# Patient Record
Sex: Male | Born: 1950 | Race: Black or African American | Hispanic: No | Marital: Single | Smoking: Former smoker
Health system: Southern US, Community
[De-identification: ages and names within clinical notes are randomized; demographics above are authoritative.]

## PROBLEM LIST (undated history)

## (undated) DIAGNOSIS — K219 Gastro-esophageal reflux disease without esophagitis: Secondary | ICD-10-CM

## (undated) DIAGNOSIS — E785 Hyperlipidemia, unspecified: Secondary | ICD-10-CM

## (undated) DIAGNOSIS — I10 Essential (primary) hypertension: Secondary | ICD-10-CM

## (undated) HISTORY — DX: Hyperlipidemia, unspecified: E78.5

## (undated) HISTORY — PX: TONSILLECTOMY: SUR1361

## (undated) HISTORY — PX: WISDOM TOOTH EXTRACTION: SHX21

## (undated) HISTORY — DX: Gastro-esophageal reflux disease without esophagitis: K21.9

## (undated) HISTORY — PX: KNEE SURGERY: SHX244

---

## 1987-02-23 DIAGNOSIS — F1910 Other psychoactive substance abuse, uncomplicated: Secondary | ICD-10-CM

## 1987-02-23 HISTORY — DX: Other psychoactive substance abuse, uncomplicated: F19.10

## 2001-05-29 ENCOUNTER — Emergency Department (HOSPITAL_COMMUNITY): Admission: EM | Admit: 2001-05-29 | Discharge: 2001-05-29 | Payer: Self-pay | Admitting: Unknown Physician Specialty

## 2001-08-24 ENCOUNTER — Encounter: Payer: Self-pay | Admitting: Internal Medicine

## 2001-08-24 ENCOUNTER — Encounter: Admission: RE | Admit: 2001-08-24 | Discharge: 2001-08-24 | Payer: Self-pay | Admitting: Internal Medicine

## 2001-11-09 ENCOUNTER — Encounter: Payer: Self-pay | Admitting: *Deleted

## 2001-11-09 ENCOUNTER — Emergency Department (HOSPITAL_COMMUNITY): Admission: EM | Admit: 2001-11-09 | Discharge: 2001-11-09 | Payer: Self-pay | Admitting: *Deleted

## 2012-05-10 ENCOUNTER — Encounter (HOSPITAL_COMMUNITY): Payer: Self-pay | Admitting: Emergency Medicine

## 2012-05-10 ENCOUNTER — Emergency Department (HOSPITAL_COMMUNITY)
Admission: EM | Admit: 2012-05-10 | Discharge: 2012-05-10 | Disposition: A | Discharge status: Home / Self Care | Payer: Federal, State, Local not specified - PPO | Attending: Emergency Medicine | Admitting: Emergency Medicine

## 2012-05-10 DIAGNOSIS — I10 Essential (primary) hypertension: Secondary | ICD-10-CM | POA: Insufficient documentation

## 2012-05-10 DIAGNOSIS — Z79899 Other long term (current) drug therapy: Secondary | ICD-10-CM | POA: Insufficient documentation

## 2012-05-10 HISTORY — DX: Essential (primary) hypertension: I10

## 2012-05-10 NOTE — ED Provider Notes (Signed)
History     CSN: [id number]  Arrival date & time 05/10/12  0825   First MD Initiated Contact with Patient 05/10/12 [phone]      Chief Complaint  Patient presents with  . Hypertension    (Consider location/radiation/quality/duration/timing/severity/associated sxs/prior treatment) HPI Comments: 62 y/o male with a history of hypertension presents to the ED with concerns of elevated blood pressure. He monitors his blood pressure daily and states last night it was 148/89 increasing to 149/90 this morning. BP reading last night was prior to taking his lisinopril which he takes before bed. He has not taken any medications today. BP upon arrival at ED 135/71. Denies any associated symptoms including chest pain, sob, headache, dizziness, visual changes, nausea or vomiting. His wife advised him to go to the ED[facility]8.  Patient is a 62 y.o. male presenting with hypertension. The history is provided by the patient.  Hypertension Pertinent negatives include no chest pain, headaches, nausea, neck pain, vomiting or weakness.    Past Medical History  Diagnosis Date  . Hypertension     History reviewed. No pertinent past surgical history.  History reviewed. No pertinent family history.  History  Substance Use Topics  . Smoking status: Not on file  . Smokeless tobacco: Not on file  . Alcohol Use: Not on file      Review of Systems  Constitutional: Negative for activity change.  HENT: Negative for neck pain and neck stiffness.   Eyes: Negative for visual disturbance.  Respiratory: Negative for shortness of breath.   Cardiovascular: Negative for chest pain.  Gastrointestinal: Negative for nausea and vomiting.  Neurological: Negative for weakness and headaches.  All other systems reviewed and are negative.    Allergies  Review of patient's allergies indicates not on file.  Home Medications  No current outpatient prescriptions on  file.  BP 135/71  Pulse 84  Temp(Src) 98.2 F (36.8 C) (Oral)  Resp 18  SpO2 100%  Physical Exam  Nursing note and vitals reviewed. Constitutional: He is oriented to person, place, and time. He appears well-developed and well-nourished. No distress.  HENT:  Head: Normocephalic and atraumatic.  Mouth/Throat: Oropharynx is clear and moist.  Eyes: Conjunctivae and EOM are normal. Pupils are equal, round, and reactive to light. No scleral icterus.  Neck: Normal range of motion. Neck supple. No JVD present. No tracheal deviation present.  Cardiovascular: Normal rate, regular rhythm, normal heart sounds and intact distal pulses.   No extremity edema.  Pulmonary/Chest: Effort normal and breath sounds normal. No respiratory distress. He has no wheezes. He has no rales.  Abdominal: Soft. Bowel sounds are normal.  Musculoskeletal: Normal range of motion. He exhibits no edema.  Neurological: He is alert and oriented to person, place, and time.  Skin: Skin is warm and dry.  Psychiatric: He has a normal mood and affect. His behavior is normal.    ED Course  Procedures (including critical care time)  Labs Reviewed - No data to display No results found.   1. Hypertension       MDM  62 y/o male with HTN. BP in ED 135/71. He has not taken his meds yet today as he takes them prior to going to bed. No associated symptoms. He came here for reassurance. He has close f/u with the [place]. Aware to continue taking lisinopril as prescribed. Return precautions discussed. Stable for discharge. Patient states understanding of plan and is agreeable.  Trevor Mace, PA-C 05/10/12 [phone]

## 2012-05-10 NOTE — ED Notes (Addendum)
Pt escorted to discharge window. Verbalized understanding discharge instructions. In no acute distress.  Vitals are stable.

## 2012-05-10 NOTE — ED Notes (Signed)
Pt states hx of htn.  States that he keeps a close check on his BP.  He took it last night and it was 148/89.  States he came in today because his BP this morning went up to 149/90.  Current BP 135/71.  No other complaints.

## 2012-05-15 NOTE — ED Provider Notes (Signed)
Medical screening examination/treatment/procedure(s) were performed by non-physician practitioner and as supervising physician I was immediately available for consultation/collaboration.   Arlen Dupuis E Zakeya Junker, MD 05/15/12 0747 

## 2013-06-08 ENCOUNTER — Ambulatory Visit: Payer: Federal, State, Local not specified - PPO | Attending: Internal Medicine | Admitting: Physical Therapy

## 2013-06-08 DIAGNOSIS — IMO0001 Reserved for inherently not codable concepts without codable children: Secondary | ICD-10-CM | POA: Insufficient documentation

## 2013-06-08 DIAGNOSIS — M542 Cervicalgia: Secondary | ICD-10-CM | POA: Insufficient documentation

## 2013-06-15 ENCOUNTER — Ambulatory Visit: Payer: Federal, State, Local not specified - PPO | Admitting: Physical Therapy

## 2013-06-22 ENCOUNTER — Ambulatory Visit: Payer: Federal, State, Local not specified - PPO | Attending: Internal Medicine | Admitting: Physical Therapy

## 2013-06-22 DIAGNOSIS — M542 Cervicalgia: Secondary | ICD-10-CM | POA: Insufficient documentation

## 2013-06-22 DIAGNOSIS — M5412 Radiculopathy, cervical region: Secondary | ICD-10-CM | POA: Insufficient documentation

## 2013-06-22 DIAGNOSIS — IMO0001 Reserved for inherently not codable concepts without codable children: Secondary | ICD-10-CM | POA: Insufficient documentation

## 2013-07-06 ENCOUNTER — Ambulatory Visit: Payer: Federal, State, Local not specified - PPO | Admitting: Physical Therapy

## 2013-07-13 ENCOUNTER — Ambulatory Visit: Payer: Federal, State, Local not specified - PPO | Admitting: Physical Therapy

## 2013-07-20 ENCOUNTER — Ambulatory Visit: Payer: Federal, State, Local not specified - PPO | Admitting: Physical Therapy

## 2013-07-30 ENCOUNTER — Encounter (HOSPITAL_COMMUNITY): Payer: Self-pay | Admitting: Emergency Medicine

## 2013-07-30 ENCOUNTER — Emergency Department (HOSPITAL_COMMUNITY)
Admission: EM | Admit: 2013-07-30 | Discharge: 2013-07-30 | Disposition: A | Discharge status: Home / Self Care | Payer: Federal, State, Local not specified - PPO | Source: Home / Self Care | Attending: Emergency Medicine | Admitting: Emergency Medicine

## 2013-07-30 DIAGNOSIS — M25469 Effusion, unspecified knee: Secondary | ICD-10-CM

## 2013-07-30 DIAGNOSIS — M25461 Effusion, right knee: Secondary | ICD-10-CM

## 2013-07-30 MED ORDER — INDOMETHACIN 25 MG PO CAPS: 25.0000 mg | ORAL_CAPSULE | Freq: Three times a day (TID) | ORAL | Status: DC

## 2013-07-30 NOTE — Discharge Instructions (Signed)
Medication as directed with ice, elevation and Ace wrap to try to reduce swelling. If these therapies do not help, please follow up with either the orthopedist listed on your discharge instructions or the after hours orthopedics clinic on the information sheet you have been given.   Knee Effusion The medical term for having fluid in your knee is effusion. This is often due to an internal derangement of the knee. This means something is wrong inside the knee. Some of the causes of fluid in the knee may be torn cartilage, a torn ligament, or bleeding into the joint from an injury. Your knee is likely more difficult to bend and move. This is often because there is increased pain and pressure in the joint. The time it takes for recovery from a knee effusion depends on different factors, including:   Type of injury.  Your age.  Physical and medical conditions.  Rehabilitation Strategies. How long you will be away from your normal activities will depend on what kind of knee problem you have and how much damage is present. Your knee has two types of cartilage. Articular cartilage covers the bone ends and lets your knee bend and move smoothly. Two menisci, thick pads of cartilage that form a rim inside the joint, help absorb shock and stabilize your knee. Ligaments bind the bones together and support your knee joint. Muscles move the joint, help support your knee, and take stress off the joint itself. CAUSES  Often an effusion in the knee is caused by an injury to one of the menisci. This is often a tear in the cartilage. Recovery after a meniscus injury depends on how much meniscus is damaged and whether you have damaged other knee tissue. Small tears may heal on their own with conservative treatment. Conservative means rest, limited weight bearing activity and muscle strengthening exercises. Your recovery may take up to 6 weeks.  TREATMENT  Larger tears may require surgery. Meniscus injuries may be treated  during arthroscopy. Arthroscopy is a procedure in which your surgeon uses a small telescope like instrument to look in your knee. Your caregiver can make a more accurate diagnosis (learning what is wrong) by performing an arthroscopic procedure. If your injury is on the inner margin of the meniscus, your surgeon may trim the meniscus back to a smooth rim. In other cases your surgeon will try to repair a damaged meniscus with stitches (sutures). This may make rehabilitation take longer, but may provide better long term result by helping your knee keep its shock absorption capabilities. Ligaments which are completely torn usually require surgery for repair. HOME CARE INSTRUCTIONS  Use crutches as instructed.  If a brace is applied, use as directed.  Once you are home, an ice pack applied to your swollen knee may help with discomfort and help decrease swelling.  Keep your knee raised (elevated) when you are not up and around or on crutches.  Only take over-the-counter or prescription medicines for pain, discomfort, or fever as directed by your caregiver.  Your caregivers will help with instructions for rehabilitation of your knee. This often includes strengthening exercises.  You may resume a normal diet and activities as directed. SEEK MEDICAL CARE IF:   There is increased swelling in your knee.  You notice redness, swelling, or increasing pain in your knee.  An unexplained oral temperature above 102 F (38.9 C) develops. SEEK IMMEDIATE MEDICAL CARE IF:   You develop a rash.  You have difficulty breathing.  You have any  allergic reactions from medications you may have been given.  There is severe pain with any motion of the knee. MAKE SURE YOU:   Understand these instructions.  Will watch your condition.  Will get help right away if you are not doing well or get worse. Document Released: 05/01/2003 Document Revised: 05/03/2011 Document Reviewed: 07/05/2007 [facility] Patient  Information 2014 [place], [place].

## 2013-07-30 NOTE — ED Provider Notes (Signed)
CSN: [id number]     Arrival date & time 07/30/13  1929 History   First MD Initiated Contact with Patient 07/30/13 2025     Chief Complaint  Patient presents with  . Knee Pain   (Consider location/radiation/quality/duration/timing/severity/associated sxs/prior Treatment) HPI Comments: Patient reports chronic history of intermittent right knee swelling with discomfort. States 3-4 years ago he had knee drained by an orthopedist and cortisone injected into knee joint, but cannot recall the name of his provider. States that he has had intermittent episdes of right knee swelling over past 2-3 months with current episode having begun 2 weeks ago. Saw his PCP in [place], [place] for same on 07-28-2013 and was prescribed prednisone, but he states he does not wish to take this medication because "it makes me fat." Denies fever or history of gout. States pain is minimal but joint is uncomfortable because of swelling.  Patient is a 63 y.o. male presenting with knee pain.  Knee Pain   Past Medical History  Diagnosis Date  . Hypertension    Past Surgical History  Procedure Laterality Date  . Knee surgery     No family history on file. History  Substance Use Topics  . Smoking status: Never Smoker   . Smokeless tobacco: Not on file  . Alcohol Use: No    Review of Systems  All other systems reviewed and are negative.   Allergies  Review of patient's allergies indicates no known allergies.  Home Medications   Prior to Admission medications   Medication Sig Start Date End Date Taking? Authorizing Provider  nortriptyline (PAMELOR) 25 MG capsule Take 25 mg by mouth at bedtime.   Yes Historical Provider, MD  traMADol (ULTRAM) 50 MG tablet Take by mouth every 6 (six) hours as needed.   Yes Historical Provider, MD  ALPRAZolam Prudy Feeler(XANAX) 1 MG tablet Take 1 mg by mouth 2 (two) times daily.    Historical Provider, MD  esomeprazole (NEXIUM) 40 MG capsule Take 40 mg by mouth daily before breakfast.     Historical Provider, MD  HYDROcodone-acetaminophen (NORCO/VICODIN) 5-325 MG per tablet Take 1 tablet by mouth 2 (two) times daily as needed for pain.    Historical Provider, MD  indomethacin (INDOCIN) 25 MG capsule Take 1 capsule (25 mg total) by mouth 3 (three) times daily with meals. X 7 days Do not use in combination with ibuprofen 07/30/13   Ardis RowanJennifer Lee Clemmie Buelna, PA  lisinopril (PRINIVIL,ZESTRIL) 40 MG tablet Take 20 mg by mouth daily.    Historical Provider, MD   BP 133/91  Pulse 74  Temp(Src) 97.5 F (36.4 C) (Oral)  Resp 16  SpO2 96% Physical Exam  Nursing note and vitals reviewed. Constitutional: He is oriented to person, place, and time. He appears well-developed and well-nourished. No distress.  HENT:  Head: Normocephalic and atraumatic.  Eyes: Conjunctivae are normal. No scleral icterus.  Cardiovascular: Normal rate.   Pulmonary/Chest: Effort normal.  Musculoskeletal: Normal range of motion.       Right knee: He exhibits swelling and effusion. He exhibits normal range of motion, no ecchymosis, no deformity, no laceration, no erythema, normal alignment, no LCL laxity, normal patellar mobility, no bony tenderness and no MCL laxity. No tenderness found. No medial joint line, no lateral joint line and no patellar tendon tenderness noted.  Moderate right knee effusion. No tenderness, erythema, warmth or induration   Neurological: He is alert and oriented to person, place, and time.  Skin: Skin is warm and dry. No rash noted.  No erythema.  Psychiatric: He has a normal mood and affect. His behavior is normal.    ED Course  Procedures (including critical care time) Labs Review Labs Reviewed - No data to display  Imaging Review No results found.   MDM   1. Knee effusion, right   Ace wrap, elevation, ice, indocin as directed x 7 days. If no improvement, use orthopedic referral information for follow up evaluation and possible joint aspiration. No clinical concern for septic  joint.    Jess Barters [place], [place] 07/30/13 2110

## 2013-07-30 NOTE — ED Notes (Signed)
Right knee with fluid on his knee.  History of the same.  Patient usually goes to [facility] hospital.  Particular aching of right knee made patient seek medical attention tonight

## 2013-07-31 NOTE — ED Provider Notes (Signed)
Medical screening examination/treatment/procedure(s) were performed by non-physician practitioner and as supervising physician I was immediately available for consultation/collaboration.  Leslee Home, M.D.  Reuben Likes, MD 07/31/13 [phone]

## 2013-08-03 ENCOUNTER — Ambulatory Visit: Payer: Federal, State, Local not specified - PPO | Admitting: Physical Therapy

## 2013-08-10 ENCOUNTER — Ambulatory Visit: Payer: Federal, State, Local not specified - PPO | Attending: Internal Medicine | Admitting: Physical Therapy

## 2013-08-10 DIAGNOSIS — IMO0001 Reserved for inherently not codable concepts without codable children: Secondary | ICD-10-CM | POA: Insufficient documentation

## 2013-08-10 DIAGNOSIS — M5412 Radiculopathy, cervical region: Secondary | ICD-10-CM | POA: Insufficient documentation

## 2013-08-10 DIAGNOSIS — M542 Cervicalgia: Secondary | ICD-10-CM | POA: Insufficient documentation

## 2013-08-17 ENCOUNTER — Ambulatory Visit: Payer: Federal, State, Local not specified - PPO | Admitting: Physical Therapy

## 2013-08-31 ENCOUNTER — Ambulatory Visit: Payer: Federal, State, Local not specified - PPO | Attending: Internal Medicine | Admitting: Physical Therapy

## 2013-08-31 DIAGNOSIS — M542 Cervicalgia: Secondary | ICD-10-CM | POA: Insufficient documentation

## 2013-08-31 DIAGNOSIS — IMO0001 Reserved for inherently not codable concepts without codable children: Secondary | ICD-10-CM | POA: Insufficient documentation

## 2013-08-31 DIAGNOSIS — M5412 Radiculopathy, cervical region: Secondary | ICD-10-CM | POA: Insufficient documentation

## 2014-02-22 HISTORY — PX: COLONOSCOPY: SHX174

## 2014-03-01 ENCOUNTER — Ambulatory Visit (HOSPITAL_COMMUNITY)
Admission: RE | Admit: 2014-03-01 | Discharge: 2014-03-01 | Disposition: A | Discharge status: Home / Self Care | Payer: Federal, State, Local not specified - PPO | Source: Ambulatory Visit | Attending: Internal Medicine | Admitting: Internal Medicine

## 2014-03-01 ENCOUNTER — Other Ambulatory Visit (HOSPITAL_COMMUNITY): Payer: Self-pay | Admitting: Internal Medicine

## 2014-03-01 ENCOUNTER — Other Ambulatory Visit: Payer: Self-pay

## 2014-03-01 DIAGNOSIS — R0602 Shortness of breath: Secondary | ICD-10-CM

## 2014-03-01 LAB — LIPID PANEL
CHOL/HDL RATIO: 3.6 ratio
Cholesterol: 192 mg/dL (ref 0–200)
HDL: 53 mg/dL (ref >39)
LDL Cholesterol: 128 mg/dL — ABNORMAL HIGH (ref 0–99)
Triglycerides: 57 mg/dL (ref <150)
VLDL: 11 mg/dL (ref 0–40)

## 2014-03-01 LAB — COMPREHENSIVE METABOLIC PANEL
ALBUMIN: 4.3 g/dL (ref 3.5–5.2)
ALT: 15 U/L (ref 0–53)
ANION GAP: 4 — AB (ref 5–15)
AST: 20 U/L (ref 0–37)
Alkaline Phosphatase: 73 U/L (ref 39–117)
BILIRUBIN TOTAL: 0.5 mg/dL (ref 0.3–1.2)
BUN: 12 mg/dL (ref 6–23)
CALCIUM: 9.5 mg/dL (ref 8.4–10.5)
CHLORIDE: 103 meq/L (ref 96–112)
CO2: 29 mmol/L (ref 19–32)
CREATININE: 1.15 mg/dL (ref 0.50–1.35)
GFR calc Af Amer: 78 mL/min — ABNORMAL LOW (ref >90)
GFR calc non Af Amer: 67 mL/min — ABNORMAL LOW (ref >90)
Glucose, Bld: 95 mg/dL (ref 70–99)
Potassium: 4.3 mmol/L (ref 3.5–5.1)
SODIUM: 136 mmol/L (ref 135–145)
Total Protein: 7.9 g/dL (ref 6.0–8.3)

## 2014-03-01 LAB — CK: Total CK: 186 U/L (ref 7–232)

## 2014-03-21 ENCOUNTER — Inpatient Hospital Stay (HOSPITAL_COMMUNITY)
Admission: RE | Admit: 2014-03-21 | Discharge: 2014-03-21 | Disposition: A | Discharge status: Home / Self Care | Payer: Federal, State, Local not specified - PPO | Source: Ambulatory Visit

## 2014-03-21 NOTE — Pre-Procedure Instructions (Signed)
Threasa HeadsLloyd T Prevost  03/21/2014  Your procedure is scheduled on:  04-01-2014   Monday    [facility]00 AM.  Call this number if you have problems the morning of surgery: [phone]   Remember:    Do not eat food or drink liquids after midnight.   Take these medicines the morning of surgery with A SIP OF WATER: Albuterol inhaler if needed,alprazolam(xanax),tramadol(Ultram) if needed   Do not wear jewelry,  Do not wear lotions, powders, or perfumes. You may not wear deodorant.   Do not shave 48 hours prior to surgery. Men may shave face and neck.   Do not bring valuables to the hospital.  [facility] is not responsible  for any belongings or valuables.               Contacts, dentures or bridgework may not be worn into surgery.   Leave suitcase in the car. After surgery it may be brought to your room.   For patients admitted to the hospital, discharge time is determined by your  treatment team.                   Special Instructions: See attached sheet for instructions on CHG shower     Please read over the following fact sheets that you were given: Pain Booklet, Coughing and Deep Breathing, Blood Transfusion Information and Surgical Site Infection Prevention

## 2014-04-01 ENCOUNTER — Encounter (HOSPITAL_COMMUNITY): Admission: RE | Payer: Self-pay | Source: Ambulatory Visit

## 2014-04-01 ENCOUNTER — Inpatient Hospital Stay (HOSPITAL_COMMUNITY)
Admission: RE | Admit: 2014-04-01 | Payer: Federal, State, Local not specified - PPO | Source: Ambulatory Visit | Admitting: Orthopedic Surgery

## 2014-04-01 SURGERY — ARTHROPLASTY, KNEE, TOTAL
Anesthesia: General | Site: Knee | Laterality: Left

## 2014-05-08 ENCOUNTER — Encounter (HOSPITAL_COMMUNITY): Payer: Self-pay | Admitting: Emergency Medicine

## 2014-05-08 ENCOUNTER — Emergency Department (HOSPITAL_COMMUNITY)
Admission: EM | Admit: 2014-05-08 | Discharge: 2014-05-08 | Disposition: A | Discharge status: Home / Self Care | Payer: Federal, State, Local not specified - PPO | Attending: Emergency Medicine | Admitting: Emergency Medicine

## 2014-05-08 DIAGNOSIS — Z79899 Other long term (current) drug therapy: Secondary | ICD-10-CM | POA: Diagnosis not present

## 2014-05-08 DIAGNOSIS — Z791 Long term (current) use of non-steroidal anti-inflammatories (NSAID): Secondary | ICD-10-CM | POA: Insufficient documentation

## 2014-05-08 DIAGNOSIS — I10 Essential (primary) hypertension: Secondary | ICD-10-CM | POA: Diagnosis not present

## 2014-05-08 DIAGNOSIS — R220 Localized swelling, mass and lump, head: Secondary | ICD-10-CM | POA: Diagnosis present

## 2014-05-08 DIAGNOSIS — K047 Periapical abscess without sinus: Secondary | ICD-10-CM | POA: Diagnosis not present

## 2014-05-08 MED ORDER — AMOXICILLIN-POT CLAVULANATE 875-125 MG PO TABS
1.0000 | ORAL_TABLET | Freq: Two times a day (BID) | ORAL | Status: DC
Start: 2014-05-08 — End: 2017-05-09

## 2014-05-08 MED ORDER — HYDROCODONE-ACETAMINOPHEN 5-325 MG PO TABS: 1.0000 | ORAL_TABLET | Freq: Four times a day (QID) | ORAL | Status: DC | PRN

## 2014-05-08 MED ORDER — AMOXICILLIN-POT CLAVULANATE 875-125 MG PO TABS
1.0000 | ORAL_TABLET | Freq: Once | ORAL | Status: AC
  Administered 2014-05-08: 1 via ORAL
  Filled 2014-05-08: qty 1

## 2014-05-08 MED ORDER — IBUPROFEN 800 MG PO TABS
800.0000 mg | ORAL_TABLET | Freq: Once | ORAL | Status: AC
  Administered 2014-05-08: 800 mg via ORAL
  Filled 2014-05-08: qty 1

## 2014-05-08 NOTE — Discharge Instructions (Signed)
Please follow up closely with a dentist tomorrow for further care.  Return if you develop throat swelling, trouble swallowing or if you have other concerns.   Dental Abscess A dental abscess is a collection of infected fluid (pus) from a bacterial infection in the inner part of the tooth (pulp). It usually occurs at the end of the tooth's root.  CAUSES   Severe tooth decay.  Trauma to the tooth that allows bacteria to enter into the pulp, such as a broken or chipped tooth. SYMPTOMS   Severe pain in and around the infected tooth.  Swelling and redness around the abscessed tooth or in the mouth or face.  Tenderness.  Pus drainage.  Bad breath.  Bitter taste in the mouth.  Difficulty swallowing.  Difficulty opening the mouth.  Nausea.  Vomiting.  Chills.  Swollen neck glands. DIAGNOSIS   A medical and dental history will be taken.  An examination will be performed by tapping on the abscessed tooth.  X-rays may be taken of the tooth to identify the abscess. TREATMENT The goal of treatment is to eliminate the infection. You may be prescribed antibiotic medicine to stop the infection from spreading. A root canal may be performed to save the tooth. If the tooth cannot be saved, it may be pulled (extracted) and the abscess may be drained.  HOME CARE INSTRUCTIONS  Only take over-the-counter or prescription medicines for pain, fever, or discomfort as directed by your caregiver.  Rinse your mouth (gargle) often with salt water ( tsp salt in 8 oz [250 ml] of warm water) to relieve pain or swelling.  Do not drive after taking pain medicine (narcotics).  Do not apply heat to the outside of your face.  Return to your dentist for further treatment as directed. SEEK MEDICAL CARE IF:  Your pain is not helped by medicine.  Your pain is getting worse instead of better. SEEK IMMEDIATE MEDICAL CARE IF:  You have a fever or persistent symptoms for more than 2-3 days.  You  have a fever and your symptoms suddenly get worse.  You have chills or a very bad headache.  You have problems breathing or swallowing.  You have trouble opening your mouth.  You have swelling in the neck or around the eye. Document Released: 02/08/2005 Document Revised: 11/03/2011 Document Reviewed: 05/19/2010 [facility] Patient Information 2015 [place]. This information is not intended to replace advice given to you by your health care provider. Make sure you discuss any questions you have with your health care provider.

## 2014-05-08 NOTE — ED Notes (Signed)
Pt c/o jaw swelling since this morning. Pt has visible swelling to R lower jaw. Pt sts he has one tooth that is loose and painful on the side. Pt sts his teeth are throbbing. Pt A&Ox4. Pt sts chewing and cold air makes the pain worse.

## 2014-05-08 NOTE — ED Provider Notes (Signed)
CSN: [id number]     Arrival date & time 05/08/14  1756 History  This chart was scribed for non-physician practitioner working with Doug SouSam Jacubowitz, MD by Angelene GiovanniEmmanuella Mensah, ED Scribe. The patient was seen in room WTR5/WTR5 and the patient's care was started at 6:24 PM      No chief complaint on file.  The history is provided by the patient. No language interpreter was used.   HPI Comments: Albert Jordan is a 64 y.o. male who presents to the Emergency Department complaining of constant gradually worsening right jaw swelling onset this morning. He explains that his teeth was loose last night and he tried to pull it with a string but when he woke up this morning, he noticed the right jaw swelling. He reports associated right teeth throbbing pain. He reports that chewing and cold air makes the pain worse. He explains that he was sweating all last night. He denies trouble swallowing. He states that he took 2 Xanax but was unable to sleep. He denies having a Education officer, communityDentist.   PCP: Dr. Roseanne RenoHassan   Past Medical History  Diagnosis Date  . Hypertension    Past Surgical History  Procedure Laterality Date  . Knee surgery     No family history on file. History  Substance Use Topics  . Smoking status: Never Smoker   . Smokeless tobacco: Not on file  . Alcohol Use: No    Review of Systems  HENT: Positive for dental problem and facial swelling. Negative for trouble swallowing.       Allergies  Review of patient's allergies indicates no known allergies.  Home Medications   Prior to Admission medications   Medication Sig Start Date End Date Taking? Authorizing Provider  albuterol (PROVENTIL HFA;VENTOLIN HFA) 108 (90 BASE) MCG/ACT inhaler Inhale 2 puffs into the lungs every 6 (six) hours as needed for wheezing or shortness of breath.    Historical Provider, MD  ALPRAZolam Prudy Feeler(XANAX) 1 MG tablet Take 1 mg by mouth 2 (two) times daily.    Historical Provider, MD  ibuprofen (ADVIL,MOTRIN) 800 MG tablet Take  800 mg by mouth every 6 (six) hours as needed (pain).    Historical Provider, MD  indomethacin (INDOCIN) 25 MG capsule Take 1 capsule (25 mg total) by mouth 3 (three) times daily with meals. X 7 days Do not use in combination with ibuprofen Patient not taking: Reported on 03/18/2014 07/30/13   Mathis FareJennifer Lee H Presson, PA  lisinopril (PRINIVIL,ZESTRIL) 40 MG tablet Take 40 mg by mouth daily.     Historical Provider, MD  lovastatin (MEVACOR) 20 MG tablet Take 20 mg by mouth daily. 02/03/14   Historical Provider, MD  nortriptyline (PAMELOR) 50 MG capsule Take 50 mg by mouth at bedtime. 03/01/14   Historical Provider, MD  traMADol (ULTRAM) 50 MG tablet Take 50 mg by mouth 3 (three) times daily. scheduled    Historical Provider, MD   There were no vitals taken for this visit. Physical Exam  Constitutional: He is oriented to person, place, and time. He appears well-developed and well-nourished. No distress.  HENT:  Head: Normocephalic and atraumatic.  Mouth/Throat: No trismus in the jaw. No uvula swelling. No oropharyngeal exudate.  Uvula midline.  No tonsillar swelling  Eyes: Conjunctivae and EOM are normal.  Neck: Neck supple. No tracheal deviation present.  Cardiovascular: Normal rate.   Pulmonary/Chest: Effort normal. No respiratory distress.  Musculoskeletal: Normal range of motion.  Tenderness to teeth 31 with moderate facial swelling noted to RL  jaw without trismus without erythema.   Neurological: He is alert and oriented to person, place, and time.  Skin: Skin is warm and dry.  Psychiatric: He has a normal mood and affect. His behavior is normal.  Nursing note and vitals reviewed.   ED Course  Procedures (including critical care time) DIAGNOSTIC STUDIES: Oxygen Saturation is 98% on RA, normal by my interpretation.    COORDINATION OF CARE: 6:28 PM- Pt advised of plan for treatment and pt agrees.   Suspect periapical abscess from the loose tooth causing facial involvement. Plan to  prescribe antibiotic, pain medication, and dental referral. Strict return precautions if patient develops trouble swallowing, worsening pain, or fever. Low suspicion for parotiditis. At this time is no evidence of airway involvement.   Labs Review Labs Reviewed - No data to display  Imaging Review No results found.   EKG Interpretation None      MDM   Final diagnoses:  Periapical abscess with facial involvement    BP 146/98 mmHg  Pulse 103  Temp(Src) 99.8 F (37.7 C) (Oral)  Resp 16  SpO2 98%   I personally performed the services described in this documentation, which was scribed in my presence. The recorded information has been reviewed and is accurate.      Fayrene Helper, PA-C 05/08/14 1902  Doug Sou, MD 05/09/14 0010

## 2016-03-16 ENCOUNTER — Emergency Department (HOSPITAL_COMMUNITY): Payer: Federal, State, Local not specified - PPO

## 2016-03-16 ENCOUNTER — Encounter (HOSPITAL_COMMUNITY): Payer: Self-pay | Admitting: Emergency Medicine

## 2016-03-16 ENCOUNTER — Emergency Department (HOSPITAL_COMMUNITY)
Admission: EM | Admit: 2016-03-16 | Discharge: 2016-03-16 | Disposition: A | Discharge status: Home / Self Care | Payer: Federal, State, Local not specified - PPO | Attending: Emergency Medicine | Admitting: Emergency Medicine

## 2016-03-16 DIAGNOSIS — Y999 Unspecified external cause status: Secondary | ICD-10-CM | POA: Diagnosis not present

## 2016-03-16 DIAGNOSIS — M7021 Olecranon bursitis, right elbow: Secondary | ICD-10-CM | POA: Diagnosis not present

## 2016-03-16 DIAGNOSIS — M25521 Pain in right elbow: Secondary | ICD-10-CM | POA: Diagnosis present

## 2016-03-16 DIAGNOSIS — Y929 Unspecified place or not applicable: Secondary | ICD-10-CM | POA: Diagnosis not present

## 2016-03-16 DIAGNOSIS — I10 Essential (primary) hypertension: Secondary | ICD-10-CM | POA: Insufficient documentation

## 2016-03-16 DIAGNOSIS — Y9389 Activity, other specified: Secondary | ICD-10-CM | POA: Diagnosis not present

## 2016-03-16 DIAGNOSIS — W000XXA Fall on same level due to ice and snow, initial encounter: Secondary | ICD-10-CM | POA: Insufficient documentation

## 2016-03-16 NOTE — ED Triage Notes (Addendum)
Patient states that he fell when it snowed on some ice. Patient states he hit his right elbow. He has iced it down but the swelling is still there. Patient states he is not in pain but wants it to be looked at.

## 2016-03-16 NOTE — ED Provider Notes (Signed)
[facility]-EMERGENCY DEPT Provider Note   CSN: [id number] Arrival date & time: 03/16/16  2120     History   Chief Complaint Chief Complaint  Patient presents with  . Elbow Pain    HPI Albert Jordan is a 66 y.o. male.  HPI Albert Jordan is a 66 y.o. male with history of hypertension, presents to emergency department complaining of right elbow pain. Patient states that he fell onto his left elbow approximately 10 days ago and again the day after that. He states he has applied ice pack and has taken some over-the-counter pain pills which has helped his pain and swelling. He states that he noticed in the last few days he developed some fluid on the elbow. He has no difficulty moving his elbow. He denies any fever or chills. He states he tried to drain the fluid with a "hot needle." He states he could not get any fluid out. He is here to have them looked at. He denies any other injuries during his fall. He denies any numbness or tingling to the hand, he has no other complaints at this time.  Past Medical History:  Diagnosis Date  . Hypertension     There are no active problems to display for this patient.   Past Surgical History:  Procedure Laterality Date  . KNEE SURGERY         Home Medications    Prior to Admission medications   Medication Sig Start Date End Date Taking? Authorizing Provider  albuterol (PROVENTIL HFA;VENTOLIN HFA) 108 (90 BASE) MCG/ACT inhaler Inhale 2 puffs into the lungs every 6 (six) hours as needed for wheezing or shortness of breath.    Historical Provider, MD  ALPRAZolam Prudy Feeler) 1 MG tablet Take 1 mg by mouth 2 (two) times daily.    Historical Provider, MD  amoxicillin-clavulanate (AUGMENTIN) 875-125 MG per tablet Take 1 tablet by mouth 2 (two) times daily. One po bid x 7 days 05/08/14   Fayrene Helper, PA-C  HYDROcodone-acetaminophen (NORCO/VICODIN) 5-325 MG per tablet Take 1 tablet by mouth every 6 (six) hours as needed for moderate pain. 05/08/14   Fayrene Helper,  PA-C  ibuprofen (ADVIL,MOTRIN) 800 MG tablet Take 800 mg by mouth every 6 (six) hours as needed (pain).    Historical Provider, MD  indomethacin (INDOCIN) 25 MG capsule Take 1 capsule (25 mg total) by mouth 3 (three) times daily with meals. X 7 days Do not use in combination with ibuprofen Patient not taking: Reported on 03/18/2014 07/30/13   Mathis Fare Presson, PA  lisinopril (PRINIVIL,ZESTRIL) 40 MG tablet Take 40 mg by mouth daily.     Historical Provider, MD  lovastatin (MEVACOR) 20 MG tablet Take 20 mg by mouth daily. 02/03/14   Historical Provider, MD  nortriptyline (PAMELOR) 50 MG capsule Take 50 mg by mouth at bedtime. 03/01/14   Historical Provider, MD    Family History History reviewed. No pertinent family history.  Social History Social History  Substance Use Topics  . Smoking status: Never Smoker  . Smokeless tobacco: Never Used  . Alcohol use No     Allergies   Patient has no known allergies.   Review of Systems Review of Systems  Constitutional: Negative for chills and fever.  Musculoskeletal: Positive for arthralgias and joint swelling. Negative for myalgias.  Skin: Negative for color change and wound.  Neurological: Negative for weakness and numbness.     Physical Exam Updated Vital Signs BP 121/75 (BP Location: Left Arm)  Pulse 63   Temp 97.6 F (36.4 C) (Oral)   Resp 16   Ht 6\' 1"  (1.854 m)   Wt 85.8 kg   SpO2 97%   BMI 24.96 kg/m   Physical Exam  Constitutional: He is oriented to person, place, and time. He appears well-developed and well-nourished. No distress.  Eyes: Conjunctivae are normal.  Neck: Neck supple.  Cardiovascular: Normal rate.   Pulmonary/Chest: No respiratory distress.  Abdominal: He exhibits no distension.  Musculoskeletal:  Swelling over right olecranon of the elbow. There is no erythema, no warmth to the touch, no tenderness. Full range of motion of the right elbow joint. Distal radial pulses intact.  Neurological: He is  oriented to person, place, and time.  Skin: Skin is warm and dry.  Nursing note and vitals reviewed.    ED Treatments / Results  Labs (all labs ordered are listed, but only abnormal results are displayed) Labs Reviewed - No data to display  EKG  EKG Interpretation None       Radiology Dg Elbow Complete Right  Result Date: 03/16/2016 CLINICAL DATA:  Elbow pain and swelling after a fall 5 days ago. EXAM: RIGHT ELBOW - COMPLETE 3+ VIEW COMPARISON:  None. FINDINGS: Posterior spurring noted on the olecranon with age indeterminate fracture. Otherwise no acute bony abnormality is evident. There is no fat pad elevation to suggest joint effusion. Posterior soft tissue fullness suggests edema. IMPRESSION: No gross fracture or joint effusion. Olecranon spur with age indeterminate fracture and overlying soft tissue swelling. Electronically Signed   By: Kennith CenterEric  Mansell M.D.   On: 03/16/2016 22:06    Procedures Procedures (including critical care time)  Medications Ordered in ED Medications - No data to display   Initial Impression / Assessment and Plan / ED Course  I have reviewed the triage vital signs and the nursing notes.  Pertinent labs & imaging results that were available during my care of the patient were reviewed by me and considered in my medical decision making (see chart for details).    Patient with what appears to be olecranon bursitis to the right elbow. Patient does have olecranon spur with age indeterminant fracture on the x-ray and soft tissue swelling. I question whether he did fracture this bone spur during his fall a week ago. I will have him follow up with orthopedic specialist. Ace wrap applied for comfort. Will start on NSAIDs.   Vitals:   03/16/16 2123  BP: 121/75  Pulse: 63  Resp: 16  Temp: 97.6 F (36.4 C)    Final Clinical Impressions(s) / ED Diagnoses   Final diagnoses:  Olecranon bursitis of right elbow    New Prescriptions Discharge Medication  List as of 03/16/2016 10:40 PM       Jaynie Crumbleatyana Dredyn Gubbels, PA-C 03/17/16 0008    Mancel BaleElliott Wentz, MD 03/17/16 [id number]

## 2016-03-16 NOTE — ED Notes (Signed)
Jon notified of order.

## 2016-03-16 NOTE — Discharge Instructions (Signed)
Your symptoms are most consistent with bursitis. You do have a bone spur that you may have broken off during your fall, however no bone fractures were seen on xray. Keep arm elevated at home. Ibuprofen or aleve for pain and inflammation. ACE wrap for support. Follow up with orthopedics.

## 2016-07-07 ENCOUNTER — Ambulatory Visit (INDEPENDENT_AMBULATORY_CARE_PROVIDER_SITE_OTHER): Payer: Self-pay | Admitting: Orthopedic Surgery

## 2016-07-16 ENCOUNTER — Encounter (INDEPENDENT_AMBULATORY_CARE_PROVIDER_SITE_OTHER): Payer: Self-pay | Admitting: Orthopedic Surgery

## 2016-07-16 ENCOUNTER — Ambulatory Visit (INDEPENDENT_AMBULATORY_CARE_PROVIDER_SITE_OTHER): Payer: Federal, State, Local not specified - PPO | Admitting: Orthopedic Surgery

## 2016-07-16 ENCOUNTER — Ambulatory Visit (INDEPENDENT_AMBULATORY_CARE_PROVIDER_SITE_OTHER): Payer: Federal, State, Local not specified - PPO

## 2016-07-16 VITALS — Ht 73.0 in | Wt 189.0 lb

## 2016-07-16 DIAGNOSIS — G8929 Other chronic pain: Secondary | ICD-10-CM | POA: Diagnosis not present

## 2016-07-16 DIAGNOSIS — M25561 Pain in right knee: Secondary | ICD-10-CM

## 2016-07-16 DIAGNOSIS — M170 Bilateral primary osteoarthritis of knee: Secondary | ICD-10-CM

## 2016-07-16 NOTE — Progress Notes (Signed)
   Office Visit Note   Patient: Albert Jordan           Date of Birth: 05/12/1950           MRN: [id number] Visit Date: 07/16/2016              Requested by: No referring provider defined for this encounter. PCP: Patient, No Pcp Per  Chief Complaint  Patient presents with  . Right Knee - Pain      HPI: Patient is a [age] year old gentleman who [facility] He has had viscous supplements he has had Pain and swelling with aspiration. He states he is unable to bend his knee secondary to effusion prior to the aspiration. Patient has worked as a Designer, fashion/clothingroofer.  Assessment & Plan: Visit Diagnoses:  1. Chronic pain of right knee   2. Bilateral primary osteoarthritis of knee     Plan: Discussed with patient his best option would be a total knee replacement. Discussed risks and benefits including infection neurovascular injury DVT need for additional surgery. Patient states he understands and would like to think about surgical intervention and call when he is ready to schedule surgery. At this time he would like to proceed with a total knee arthroplasty on the right.  Follow-Up Instructions: Return if symptoms worsen or fail to improve.   Ortho Exam  Patient is alert, oriented, no adenopathy, well-dressed, normal affect, normal respiratory effort. Patient has an antalgic gait he has valgus alignment to both knees. There is no effusion in either knee at this time collaterals and cruciates are stable. He is globally tender to palpation there is crepitation of patellofemoral joint with range of motion.  Imaging: Xr Knee 1-2 Views Right  Result Date: 07/16/2016 AP radiograph of both knees shows bone-on-bone contact medial joint line with osteophytic bone spurs both medially and laterally was some condylar sclerosis and subchondral cysts. Lateral radiograph of the right knee shows large osteophytic bone spurs with essentially bone-on-bone contact.   Labs: No  results found for: HGBA1C, ESRSEDRATE, CRP, LABURIC, REPTSTATUS, GRAMSTAIN, CULT, LABORGA  Orders:  Orders Placed This Encounter  Procedures  . XR Knee 1-2 Views Right   No orders of the defined types were placed in this encounter.    Procedures: No procedures performed  Clinical Data: No additional findings.  ROS:  All other systems negative, except as noted in the HPI. Review of Systems  Objective: Vital Signs: Ht 6\' 1"  (1.854 m)   Wt 189 lb (85.7 kg)   BMI 24.94 kg/m   Specialty Comments:  No specialty comments available.  PMFS History: Patient Active Problem List   Diagnosis Date Noted  . Bilateral primary osteoarthritis of knee 07/16/2016   Past Medical History:  Diagnosis Date  . Hypertension     History reviewed. No pertinent family history.  Past Surgical History:  Procedure Laterality Date  . KNEE SURGERY     Social History   Occupational History  . Not on file.   Social History Main Topics  . Smoking status: Never Smoker  . Smokeless tobacco: Never Used  . Alcohol use No  . Drug use: No  . Sexual activity: Not on file

## 2016-07-28 ENCOUNTER — Ambulatory Visit (HOSPITAL_COMMUNITY)
Admission: RE | Admit: 2016-07-28 | Discharge: 2016-07-28 | Disposition: A | Discharge status: Home / Self Care | Payer: Federal, State, Local not specified - PPO | Source: Ambulatory Visit | Attending: Internal Medicine | Admitting: Internal Medicine

## 2016-07-28 ENCOUNTER — Other Ambulatory Visit (HOSPITAL_COMMUNITY): Payer: Self-pay | Admitting: Internal Medicine

## 2016-07-28 DIAGNOSIS — J40 Bronchitis, not specified as acute or chronic: Secondary | ICD-10-CM

## 2016-07-28 DIAGNOSIS — R0602 Shortness of breath: Secondary | ICD-10-CM | POA: Diagnosis not present

## 2016-08-20 ENCOUNTER — Telehealth (INDEPENDENT_AMBULATORY_CARE_PROVIDER_SITE_OTHER): Payer: Self-pay | Admitting: Orthopedic Surgery

## 2016-08-20 ENCOUNTER — Ambulatory Visit (INDEPENDENT_AMBULATORY_CARE_PROVIDER_SITE_OTHER): Payer: Federal, State, Local not specified - PPO | Admitting: Orthopedic Surgery

## 2016-08-20 NOTE — Telephone Encounter (Signed)
Returned call to patient left message for a return call °

## 2017-05-09 ENCOUNTER — Emergency Department (HOSPITAL_COMMUNITY)
Admission: EM | Admit: 2017-05-09 | Discharge: 2017-05-09 | Disposition: A | Discharge status: Home / Self Care | Payer: Federal, State, Local not specified - PPO | Attending: Emergency Medicine | Admitting: Emergency Medicine

## 2017-05-09 ENCOUNTER — Other Ambulatory Visit: Payer: Self-pay

## 2017-05-09 ENCOUNTER — Encounter (HOSPITAL_COMMUNITY): Payer: Self-pay | Admitting: Emergency Medicine

## 2017-05-09 DIAGNOSIS — L237 Allergic contact dermatitis due to plants, except food: Secondary | ICD-10-CM | POA: Diagnosis not present

## 2017-05-09 DIAGNOSIS — Z79899 Other long term (current) drug therapy: Secondary | ICD-10-CM | POA: Insufficient documentation

## 2017-05-09 DIAGNOSIS — L2489 Irritant contact dermatitis due to other agents: Secondary | ICD-10-CM

## 2017-05-09 DIAGNOSIS — L299 Pruritus, unspecified: Secondary | ICD-10-CM | POA: Diagnosis not present

## 2017-05-09 DIAGNOSIS — R21 Rash and other nonspecific skin eruption: Secondary | ICD-10-CM | POA: Diagnosis present

## 2017-05-09 MED ORDER — DEXAMETHASONE 4 MG PO TABS
8.0000 mg | ORAL_TABLET | Freq: Once | ORAL | Status: AC
  Administered 2017-05-09: 8 mg via ORAL
  Filled 2017-05-09: qty 2

## 2017-05-09 MED ORDER — PREDNISONE 50 MG PO TABS: ORAL_TABLET | ORAL | 0 refills | Status: DC

## 2017-05-09 NOTE — ED Provider Notes (Signed)
[facility] Provider Note   CSN: [id number] Arrival date & time: 05/09/17  0247     History   Chief Complaint Chief Complaint  Patient presents with  . Burning Eyes    itching    HPI Albert Jordan is a 67 y.o. male.  The history is provided by the patient.  Rash   This is a new problem. The current episode started 12 to 24 hours ago. The problem has been gradually worsening. The problem is associated with plant contact. There has been no fever. The rash is present on the face and left arm. The pain is moderate. The pain has been constant since onset. Associated symptoms include itching.  Reports he had to remove some weeds from his new house, and about a day later began having a rash to his face and his left arm.  He reports with swelling around his eyes.  He suspects he was exposed to poison ivy.  No shortness of breath.  No tongue or lip swelling. No Visual changes Past Medical History:  Diagnosis Date  . Hypertension     Patient Active Problem List   Diagnosis Date Noted  . Bilateral primary osteoarthritis of knee 07/16/2016    Past Surgical History:  Procedure Laterality Date  . KNEE SURGERY         Home Medications    Prior to Admission medications   Medication Sig Start Date End Date Taking? Authorizing Provider  albuterol (PROVENTIL HFA;VENTOLIN HFA) 108 (90 BASE) MCG/ACT inhaler Inhale 2 puffs into the lungs every 6 (six) hours as needed for wheezing or shortness of breath.    [provider]  ALPRAZolam Prudy Feeler) 1 MG tablet Take 1 mg by mouth 2 (two) times daily.    [provider]  amoxicillin-clavulanate (AUGMENTIN) 875-125 MG per tablet Take 1 tablet by mouth 2 (two) times daily. One po bid x 7 days 05/08/14   Fayrene Helper, PA-C  HYDROcodone-acetaminophen (NORCO/VICODIN) 5-325 MG per tablet Take 1 tablet by mouth every 6 (six) hours as needed for moderate pain. 05/08/14   Fayrene Helper, PA-C  ibuprofen  (ADVIL,MOTRIN) 800 MG tablet Take 800 mg by mouth every 6 (six) hours as needed (pain).    [provider]  indomethacin (INDOCIN) 25 MG capsule Take 1 capsule (25 mg total) by mouth 3 (three) times daily with meals. X 7 days Do not use in combination with ibuprofen Patient not taking: Reported on 03/18/2014 07/30/13   Ria Clock, PA  lisinopril (PRINIVIL,ZESTRIL) 40 MG tablet Take 40 mg by mouth daily.     [provider]  lovastatin (MEVACOR) 20 MG tablet Take 20 mg by mouth daily. 02/03/14   [provider]  nortriptyline (PAMELOR) 50 MG capsule Take 50 mg by mouth at bedtime. 03/01/14   [provider]    Family History History reviewed. No pertinent family history.  Social History Social History   Tobacco Use  . Smoking status: Never Smoker  . Smokeless tobacco: Never Used  Substance Use Topics  . Alcohol use: No  . Drug use: No     Allergies   Patient has no known allergies.   Review of Systems Review of Systems  Constitutional: Negative for fever.  Eyes: Negative for visual disturbance.  Respiratory: Negative for shortness of breath.   Skin: Positive for itching and rash.  All other systems reviewed and are negative.    Physical Exam Updated Vital Signs BP 133/71 (BP Location:  Left Arm)   Pulse 67   Temp 98 F (36.7 C) (Oral)   Resp 18   Ht 1.854 m (6\' 1" )   Wt 88.9 kg (196 lb)   SpO2 (!) 71%   BMI 25.86 kg/m   Physical Exam CONSTITUTIONAL: Well developed/well nourished HEAD: Normocephalic/atraumatic EYES: EOMI/PERRL mild periorbital edema, no conjunctival injection ENMT: Mucous membranes moist, no angioedema NECK: supple no meningeal signs CV: S1/S2 noted, no murmurs/rubs/gallops noted LUNGS: Lungs are clear to auscultation bilaterally, no apparent distress ABDOMEN: soft NEURO: Pt is awake/alert/appropriate, moves all extremitiesx4.  No facial droop.   EXTREMITIES: pulses normal/equal, full ROM, rash  noted left arm. SKIN: warm, rash noted left arm PSYCH: no abnormalities of mood noted, alert and oriented to situation   ED Treatments / Results  Labs (all labs ordered are listed, but only abnormal results are displayed) Labs Reviewed - No data to display  EKG  EKG Interpretation None       Radiology No results found.  Procedures Procedures (including critical care time)  Medications Ordered in ED Medications  dexamethasone (DECADRON) tablet 8 mg (not administered)     Initial Impression / Assessment and Plan / ED Course  I have reviewed the triage vital signs and the nursing notes.      Patient  with probable contact dermatitis.  Will start Decadron and sent home with prednisone   Of note, initial pulse ox 71 percent, but suspect this was an error.  Repeat pulse ox 95%  Final Clinical Impressions(s) / ED Diagnoses   Final diagnoses:  Irritant contact dermatitis due to other agents    ED Discharge Orders        Ordered    predniSONE (DELTASONE) 50 MG tablet     05/09/17 0414       Zadie RhineWickline, Yazlin Ekblad, MD 05/09/17 [phone]

## 2017-05-09 NOTE — ED Notes (Signed)
Bed: [facility] Expected date:  Expected time:  Means of arrival:  Comments: 

## 2017-05-09 NOTE — ED Triage Notes (Signed)
Patient was cleaning off the side of the house. The weeds ended up being poison ivy. Patient eyes are itchy, swollen, and burning.

## 2017-12-20 IMAGING — DX DG CHEST 2V
2 series · 2 of 2 positions shown · non-contrast
Comparison: Chest x-ray of March 01, 2014

CLINICAL DATA: Clinical bronchitis.  Currently on medication.

EXAM:
CHEST  2 VIEW

[chest pa]
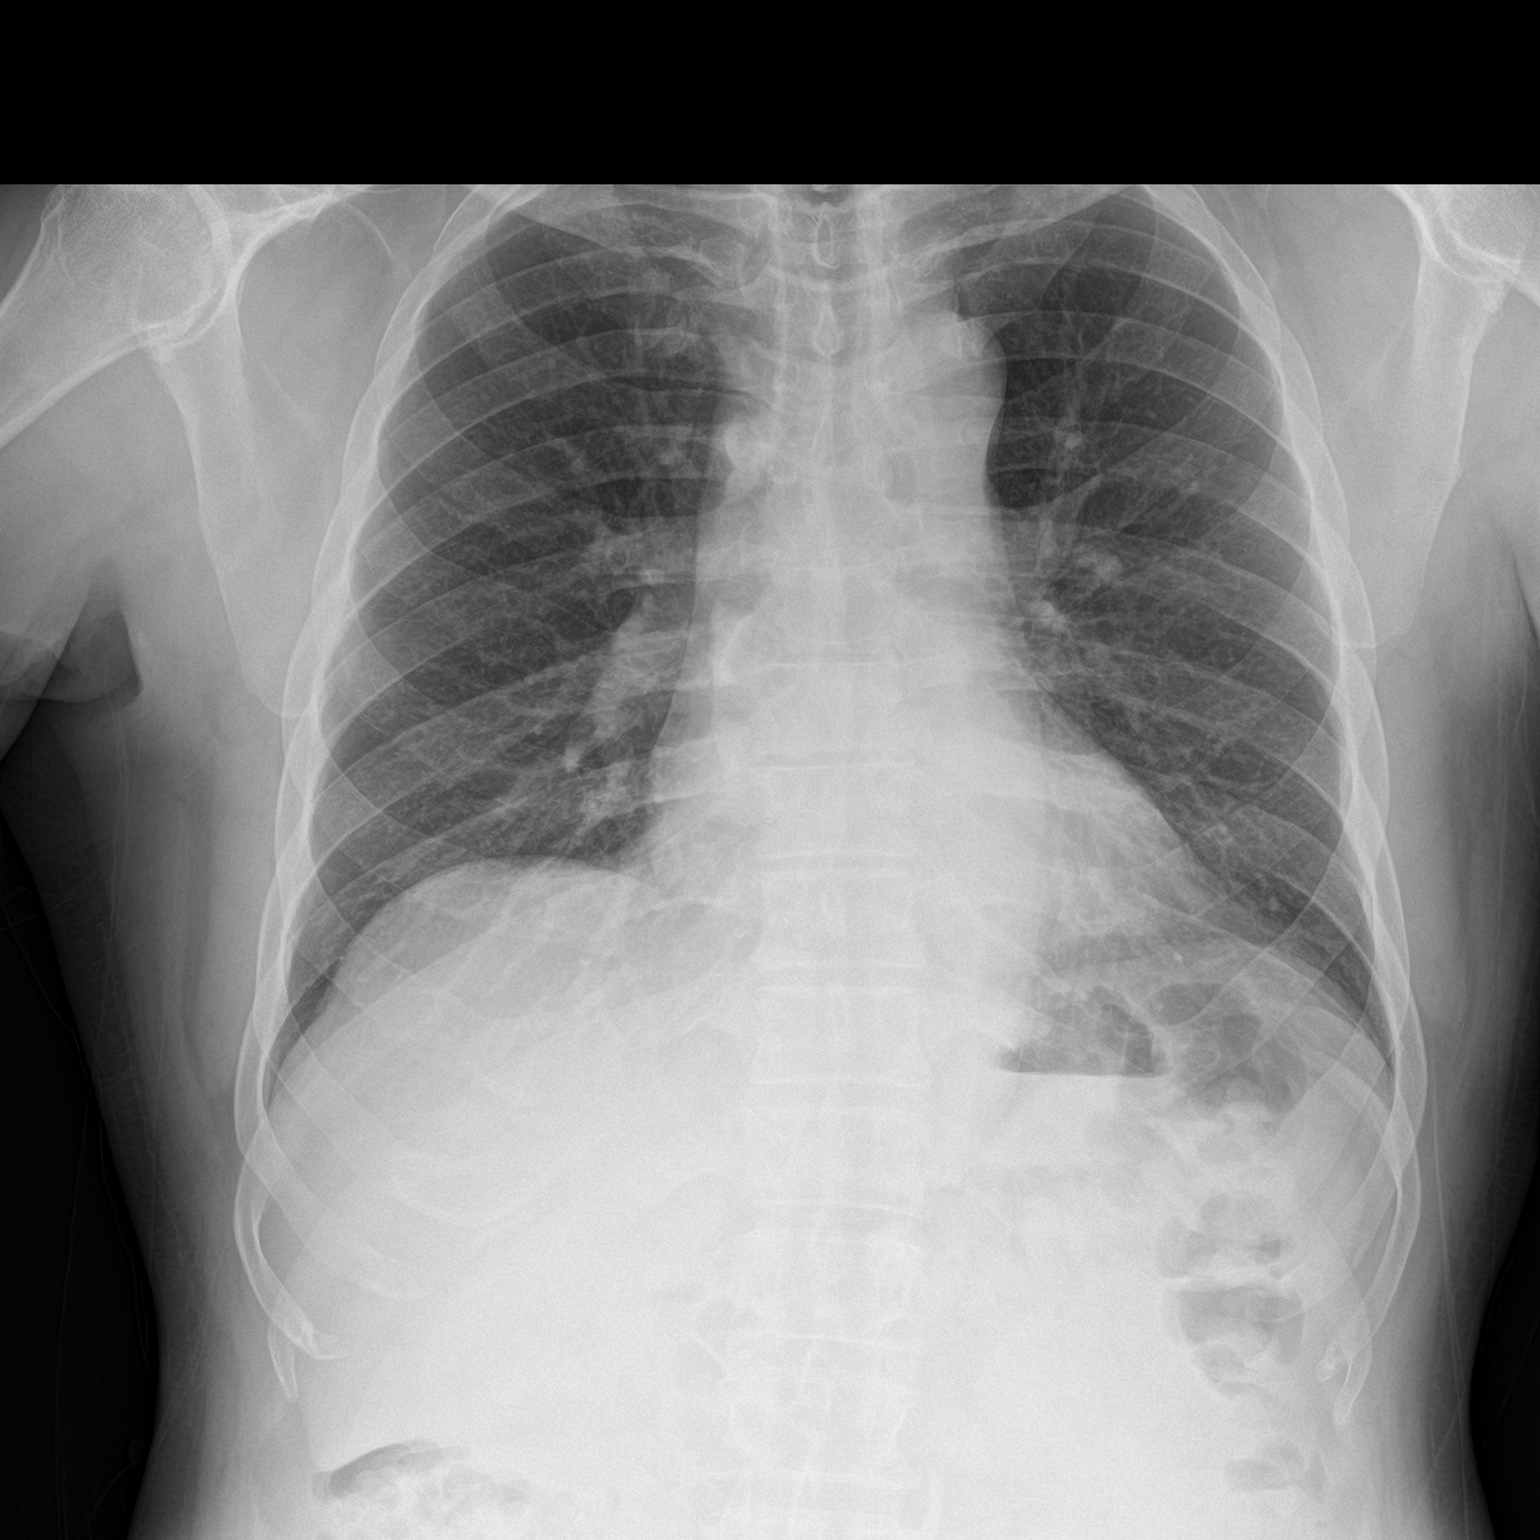

[chest lat]
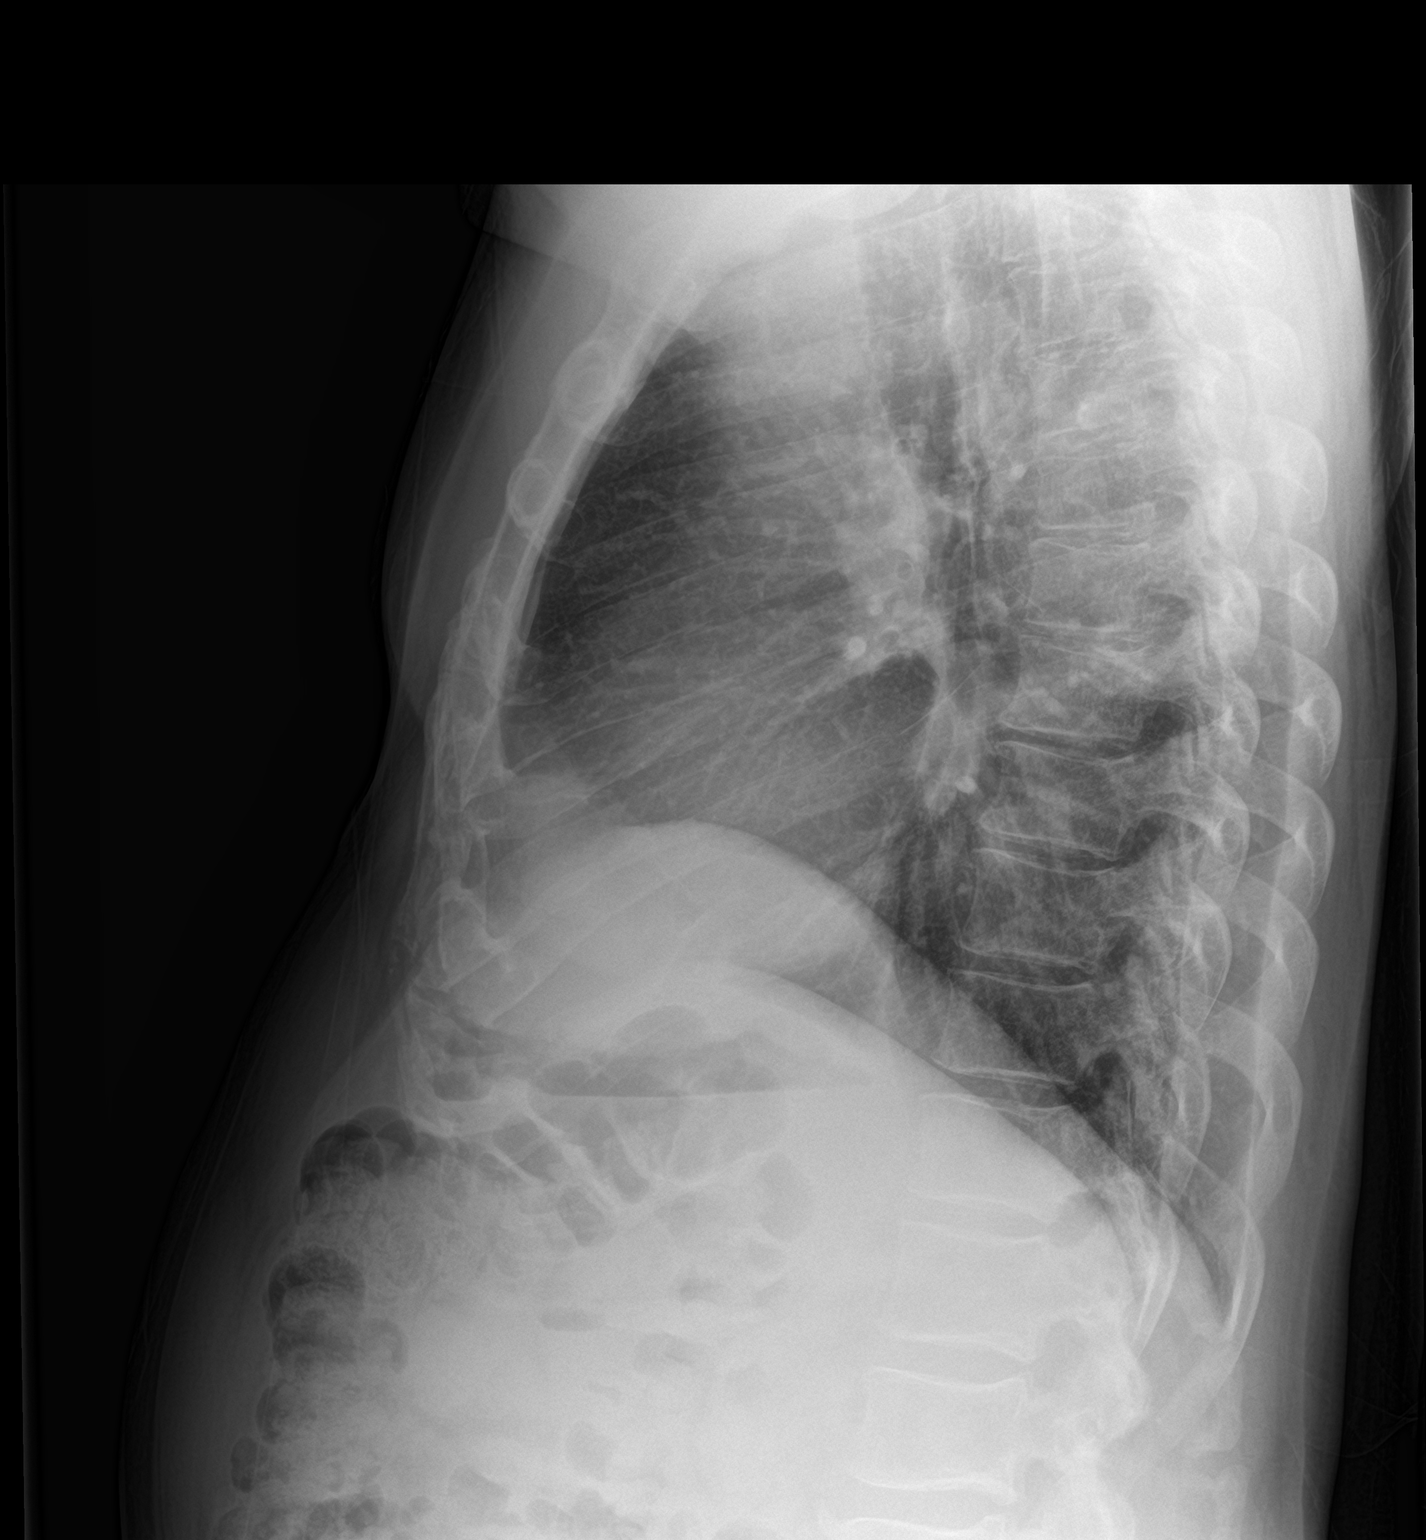

[2 of 2 positions shown; findings below may reference images not displayed]

FINDINGS: The lungs are adequately inflated and clear. There is no
hemidiaphragm flattening. The heart and pulmonary vascularity are
normal. The mediastinum is normal in width. There is no pleural
effusion. The bony thorax is unremarkable.
IMPRESSION: There is no pneumonia, hyperinflation, nor other acute
cardiopulmonary abnormality.

These results will be called to the ordering clinician or
representative by the Radiologist Assistant, and communication
documented in the PACS or zVision Dashboard.

## 2018-04-26 ENCOUNTER — Other Ambulatory Visit (HOSPITAL_COMMUNITY): Payer: Self-pay | Admitting: Internal Medicine

## 2018-04-26 ENCOUNTER — Ambulatory Visit (HOSPITAL_COMMUNITY)
Admission: RE | Admit: 2018-04-26 | Discharge: 2018-04-26 | Disposition: A | Discharge status: Home / Self Care | Payer: Medicare Other | Source: Ambulatory Visit | Attending: Internal Medicine | Admitting: Internal Medicine

## 2018-04-26 DIAGNOSIS — Z09 Encounter for follow-up examination after completed treatment for conditions other than malignant neoplasm: Secondary | ICD-10-CM | POA: Insufficient documentation

## 2019-03-16 ENCOUNTER — Ambulatory Visit: Payer: Medicare Other | Attending: Internal Medicine

## 2019-03-16 DIAGNOSIS — Z23 Encounter for immunization: Secondary | ICD-10-CM

## 2019-03-19 NOTE — Progress Notes (Signed)
   Covid-19 Vaccination Clinic  Name:  Albert Jordan    MRN: [id number] DOB: [birth year]  03/16/2019  [name] was observed post Covid-19 immunization for 15 minutes without incidence. He was provided with Vaccine Information Sheet and instruction to access the V-Safe system.   [name] was instructed to call 911 with any severe reactions post vaccine: Marland Kitchen Difficulty breathing  . Swelling of your face and throat  . A fast heartbeat  . A bad rash all over your body  . Dizziness and weakness    Immunizations Administered    Name Date Dose VIS Date Route   Moderna COVID-19 Vaccine 03/16/2019 12:16 PM 0.5 mL 01/23/2019 Intramuscular   Manufacturer: Moderna   Lot: [id number]   NDC: 24268-341-96

## 2019-04-13 ENCOUNTER — Ambulatory Visit: Payer: Medicare Other

## 2019-04-20 ENCOUNTER — Ambulatory Visit: Payer: Medicare Other | Attending: Internal Medicine

## 2019-04-20 DIAGNOSIS — Z23 Encounter for immunization: Secondary | ICD-10-CM | POA: Insufficient documentation

## 2019-04-20 NOTE — Progress Notes (Signed)
   Covid-19 Vaccination Clinic  Name:  Albert Jordan    MRN: [id number] DOB: May 03, 1950  04/20/2019  Albert Jordan was observed post Covid-19 immunization for 15 minutes without incidence. He was provided with Vaccine Information Sheet and instruction to access the V-Safe system.   Albert Jordan was instructed to call 911 with any severe reactions post vaccine: Marland Kitchen Difficulty breathing  . Swelling of your face and throat  . A fast heartbeat  . A bad rash all over your body  . Dizziness and weakness    Immunizations Administered    Name Date Dose VIS Date Route   Moderna COVID-19 Vaccine 04/20/2019 12:01 PM 0.5 mL 01/23/2019 Intramuscular   Manufacturer: Moderna   Lot: [id number]   NDC: 58592-924-46

## 2019-09-18 IMAGING — DX DG CHEST 2V
2 series · 2 of 2 positions shown · non-contrast
Comparison: 07/28/2016

CLINICAL DATA: Bronchitis, back pain

EXAM:
CHEST - 2 VIEW

[chest pa]
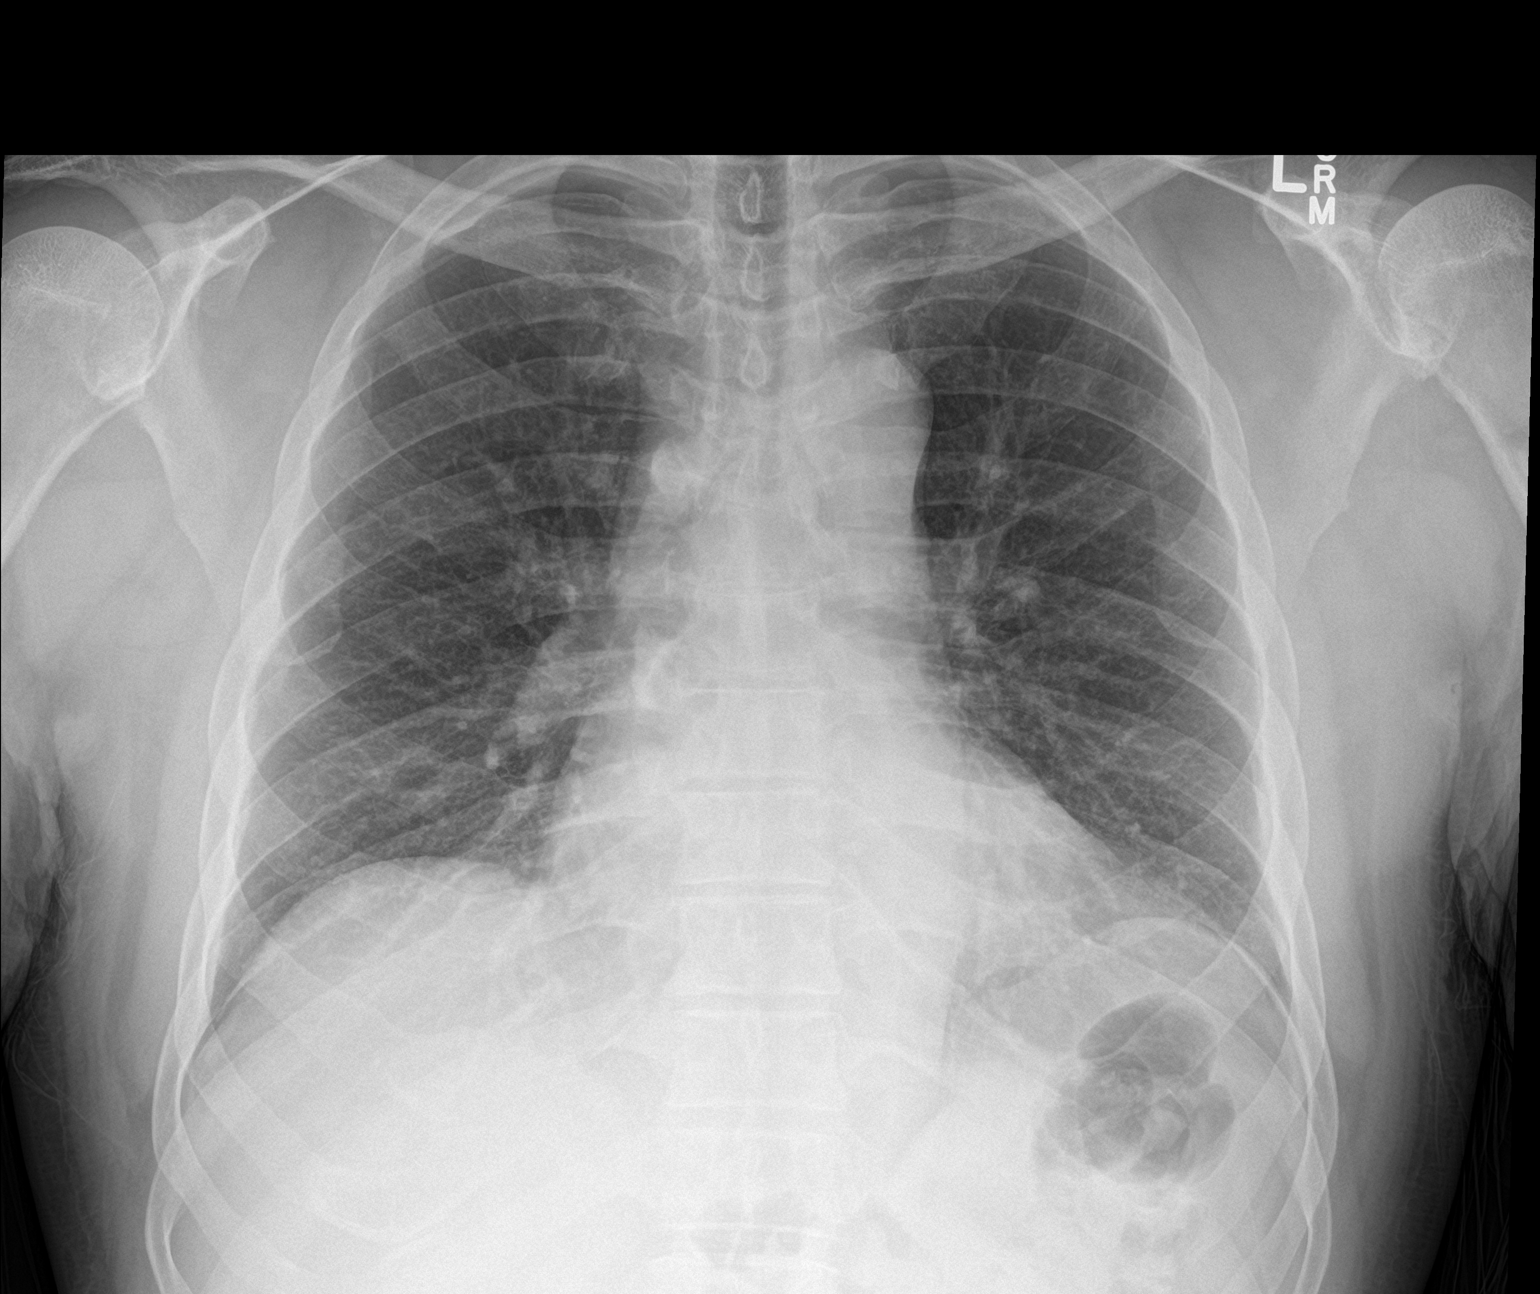

[chest lat]
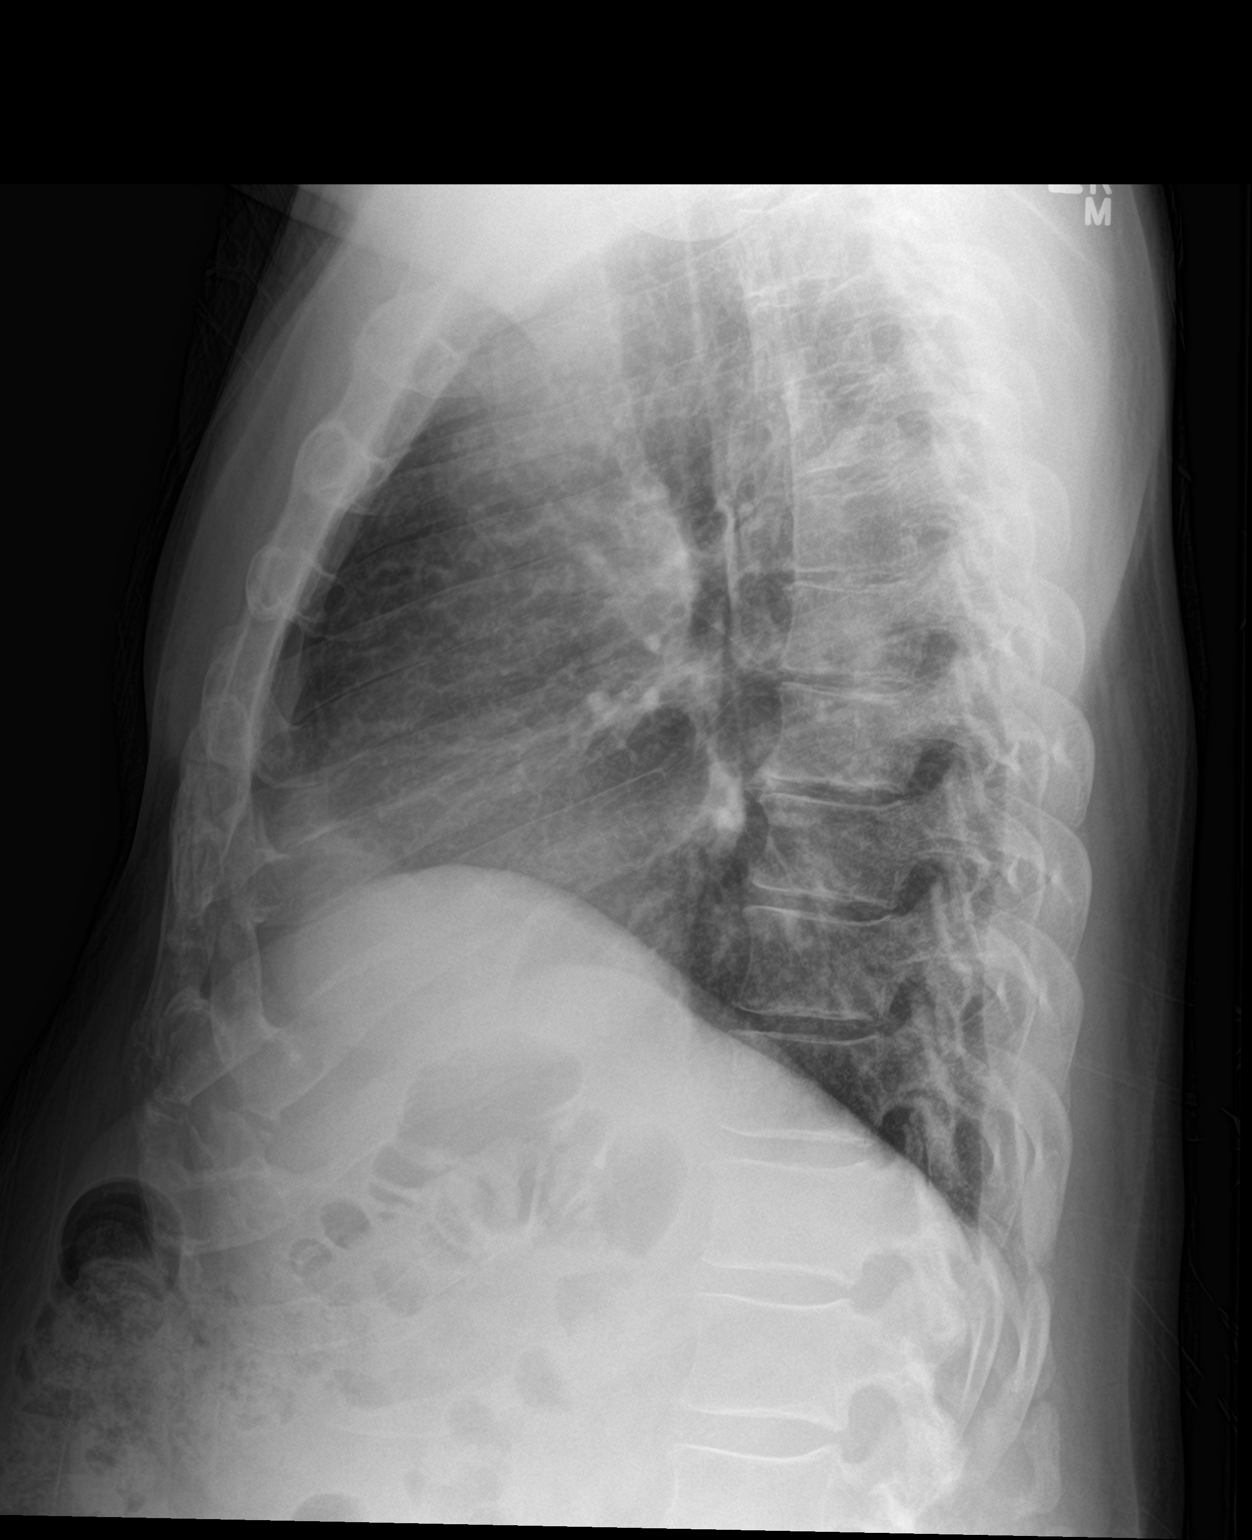

[2 of 2 positions shown; findings below may reference images not displayed]

FINDINGS: Heart and mediastinal contours are within normal limits. No focal
opacities or effusions. No acute bony abnormality.
IMPRESSION: No active cardiopulmonary disease.

## 2019-12-27 ENCOUNTER — Telehealth: Payer: Self-pay | Admitting: Gastroenterology

## 2019-12-27 NOTE — Telephone Encounter (Signed)
Hey Dr Meridee Score, this pt is being referred to [place] for a colonoscopy by Dr Quitman Livings but it looks like the pt had one done in 2016, I will send the records to you for review, please advise on scheduling.

## 2019-12-29 NOTE — Telephone Encounter (Signed)
See paper referral for next steps in evaluation and discussion.

## 2020-01-01 ENCOUNTER — Encounter: Payer: Self-pay | Admitting: Gastroenterology

## 2020-01-30 ENCOUNTER — Other Ambulatory Visit: Payer: Self-pay

## 2020-01-30 ENCOUNTER — Ambulatory Visit (AMBULATORY_SURGERY_CENTER): Payer: Self-pay

## 2020-01-30 VITALS — Ht 73.0 in | Wt 206.0 lb

## 2020-01-30 DIAGNOSIS — Z1211 Encounter for screening for malignant neoplasm of colon: Secondary | ICD-10-CM

## 2020-01-30 NOTE — Progress Notes (Signed)
No egg or soy allergy known to patient  No issues with past sedation with any surgeries or procedures No intubation problems in the past  No FH of Malignant Hyperthermia No diet pills per patient No home 02 use per patient  No blood thinners per patient  Pt denies issues with constipation  No A fib or A flutter  COVID 19 guidelines implemented in [facility] today with Pt and RN  Coupon given to pt in [facility] today , Code to Pharmacy  COVID vaccines completed on 03/2019 per pt;  Due to the COVID-19 pandemic we are asking patients to follow these guidelines. Please only bring one care partner. Please be aware that your care partner may wait in the car in the parking lot or if they feel like they will be too hot to wait in the car, they may wait in the lobby on the 4th floor. All care partners are required to wear a mask the entire time (we do not have any that we can provide them), they need to practice social distancing, and we will do a Covid check for all patient's and care partners when you arrive. Also we will check their temperature and your temperature. If the care partner waits in their car they need to stay in the parking lot the entire time and we will call them on their cell phone when the patient is ready for discharge so they can bring the car to the front of the building. Also all patient's will need to wear a mask into building. 

## 2020-02-28 ENCOUNTER — Ambulatory Visit (AMBULATORY_SURGERY_CENTER): Payer: Medicare Other | Admitting: Gastroenterology

## 2020-02-28 ENCOUNTER — Encounter: Payer: Self-pay | Admitting: Gastroenterology

## 2020-02-28 ENCOUNTER — Other Ambulatory Visit: Payer: Self-pay

## 2020-02-28 VITALS — BP 118/76 | HR 64 | Temp 97.8°F | Resp 22 | Ht 73.0 in | Wt 206.0 lb

## 2020-02-28 DIAGNOSIS — K641 Second degree hemorrhoids: Secondary | ICD-10-CM | POA: Diagnosis not present

## 2020-02-28 DIAGNOSIS — K5900 Constipation, unspecified: Secondary | ICD-10-CM | POA: Diagnosis not present

## 2020-02-28 DIAGNOSIS — Z1211 Encounter for screening for malignant neoplasm of colon: Secondary | ICD-10-CM

## 2020-02-28 MED ORDER — SODIUM CHLORIDE 0.9 % IV SOLN: 500.0000 mL | Freq: Once | INTRAVENOUS | Status: DC

## 2020-02-28 NOTE — Progress Notes (Unsigned)
PT taken to PACU. Monitors in place. VSS. Report given to RN. 

## 2020-02-28 NOTE — Patient Instructions (Signed)
YOU HAD AN ENDOSCOPIC PROCEDURE TODAY AT THE  ENDOSCOPY CENTER:   Refer to the procedure report that was given to you for any specific questions about what was found during the examination.  If the procedure report does not answer your questions, please call your gastroenterologist to clarify.  If you requested that your care partner not be given the details of your procedure findings, then the procedure report has been included in a sealed envelope for you to review at your convenience later.  YOU SHOULD EXPECT: Some feelings of bloating in the abdomen. Passage of more gas than usual.  Walking can help get rid of the air that was put into your GI tract during the procedure and reduce the bloating. If you had a lower endoscopy (such as a colonoscopy or flexible sigmoidoscopy) you may notice spotting of blood in your stool or on the toilet paper. If you underwent a bowel prep for your procedure, you may not have a normal bowel movement for a few days.  **Handouts given on High Fiber diet and hemorrhoids**  Please Note:  You might notice some irritation and congestion in your nose or some drainage.  This is from the oxygen used during your procedure.  There is no need for concern and it should clear up in a day or so.  SYMPTOMS TO REPORT IMMEDIATELY:   Following lower endoscopy (colonoscopy or flexible sigmoidoscopy):  Excessive amounts of blood in the stool  Significant tenderness or worsening of abdominal pains  Swelling of the abdomen that is new, acute  Fever of 100F or higher   For urgent or emergent issues, a gastroenterologist can be reached at any hour by calling [phone]. Do not use MyChart messaging for urgent concerns.    DIET:  We do recommend a small meal at first, but then you may proceed to your regular diet.  Drink plenty of fluids but you should avoid alcoholic beverages for 24 hours.  ACTIVITY:  You should plan to take it easy for the rest of today and you should  NOT DRIVE or use heavy machinery until tomorrow (because of the sedation medicines used during the test).    FOLLOW UP: Our staff will call the number listed on your records 48-72 hours following your procedure to check on you and address any questions or concerns that you may have regarding the information given to you following your procedure. If we do not reach you, we will leave a message.  We will attempt to reach you two times.  During this call, we will ask if you have developed any symptoms of COVID 19. If you develop any symptoms (ie: fever, flu-like symptoms, shortness of breath, cough etc.) before then, please call [phone].  If you test positive for Covid 19 in the 2 weeks post procedure, please call and report this information to [place].    If any biopsies were taken you will be contacted by phone or by letter within the next 1-3 weeks.  Please call us at [phone] if you have not heard about the biopsies in 3 weeks.    SIGNATURES/CONFIDENTIALITY: You and/or your care partner have signed paperwork which will be entered into your electronic medical record.  These signatures attest to the fact that that the information above on your After Visit Summary has been reviewed and is understood.  Full responsibility of the confidentiality of this discharge information lies with you and/or your care-partner.

## 2020-02-28 NOTE — Op Note (Signed)
[place] Patient Name: Rudolpho Jordan Procedure Date: 02/28/2020 1:19 PM MRN: [id number] Endoscopist: Justice Britain , MD Age: 70 Referring MD:  Date of Birth: [birth year] Gender: Male Account #: [id number] Procedure:                Colonoscopy Indications:              Colon cancer screening in patient at increased                            risk: Family history of 1st-degree relative with                            colon polyps before age 64 years, Incidental                            constipation noted; last in 2016 poor preparation Medicines:                Monitored Anesthesia Care Procedure:                Pre-Anesthesia Assessment:                           - Prior to the procedure, a History and Physical                            was performed, and patient medications and                            allergies were reviewed. The patient's tolerance of                            previous anesthesia was also reviewed. The risks                            and benefits of the procedure and the sedation                            options and risks were discussed with the patient.                            All questions were answered, and informed consent                            was obtained. Prior Anticoagulants: The patient has                            taken no previous anticoagulant or antiplatelet                            agents except for NSAID medication. ASA Grade                            Assessment: II - A patient with mild systemic  disease. After reviewing the risks and benefits,                            the patient was deemed in satisfactory condition to                            undergo the procedure.                           After obtaining informed consent, the colonoscope                            was passed under direct vision. Throughout the                            procedure, the patient's blood pressure,  pulse, and                            oxygen saturations were monitored continuously. The                            Olympus CF-HQ190L ([id number]) Colonoscope was                            introduced through the anus and advanced to the the                            cecum, identified by the ileocecal valve. The                            colonoscopy was performed without difficulty. The                            patient tolerated the procedure. The quality of the                            bowel preparation was good. The ileocecal valve,                            appendiceal orifice, and rectum were photographed. Scope In: 1:31:49 PM Scope Out: 1:44:12 PM Scope Withdrawal Time: 0 hours 9 minutes 52 seconds  Total Procedure Duration: 0 hours 12 minutes 23 seconds  Findings:                 The digital rectal exam findings include                            hemorrhoids. Pertinent negatives include no                            palpable rectal lesions.                           Normal mucosa was found in the entire colon.  Non-bleeding non-thrombosed external and internal                            hemorrhoids were found during retroflexion, during                            perianal exam and during digital exam. The                            hemorrhoids were Grade II (internal hemorrhoids                            that prolapse but reduce spontaneously). Complications:            No immediate complications. Estimated Blood Loss:     Estimated blood loss: none. Impression:               - Hemorrhoids found on digital rectal exam.                           - Normal mucosa in the entire examined colon.                           - Non-bleeding non-thrombosed external and internal                            hemorrhoids. Recommendation:           - The patient will be observed post-procedure,                            until all discharge criteria are met.                            - Discharge patient to home.                           - Patient has a contact number available for                            emergencies. The signs and symptoms of potential                            delayed complications were discussed with the                            patient. Return to normal activities tomorrow.                            Written discharge instructions were provided to the                            patient.                           - High fiber diet.                           -  Use FiberCon 1-2 tablets PO daily.                           - Continue present medications.                           - Repeat colonoscopy in 5-7 years for screening                            purposes due to family history of colon polyps can                            be considered based on patient's follow up and                            medical comorbidities.                           - The findings and recommendations were discussed                            with the patient.                           - The findings and recommendations were discussed                            with the designated responsible adult. Justice Britain, MD 02/28/2020 1:52:49 PM

## 2020-02-28 NOTE — Progress Notes (Signed)
Pt's states no medical or surgical changes since previsit or office visit. 

## 2020-03-03 ENCOUNTER — Telehealth: Payer: Self-pay | Admitting: *Deleted

## 2020-03-03 NOTE — Telephone Encounter (Signed)
  Follow up Call-  Call back number 02/28/2020  Post procedure Call Back phone  # [phone]  Permission to leave phone message Yes  Some recent data might be hidden     Patient questions:  Do you have a fever, pain , or abdominal swelling? No. Pain Score  0 *  Have you tolerated food without any problems? Yes.    Have you been able to return to your normal activities? Yes.    Do you have any questions about your discharge instructions: Diet   No. Medications  No. Follow up visit  No.  Do you have questions or concerns about your Care? No.  Actions: * If pain score is 4 or above: No action needed, pain <4.  1. Have you developed a fever since your procedure? no  2.   Have you had an respiratory symptoms (SOB or cough) since your procedure? no  3.   Have you tested positive for COVID 19 since your procedure no  4.   Have you had any family members/close contacts diagnosed with the COVID 19 since your procedure?  no   If yes to any of these questions please route to Laverna Peace, RN and Karlton Lemon, RN

## 2022-05-20 LAB — AMB RESULTS CONSOLE CBG: Glucose: 243

## 2022-05-20 LAB — HEMOGLOBIN A1C: Hemoglobin A1C: 8.5

## 2022-05-20 NOTE — Progress Notes (Signed)
The patient did not have any SDOH need at this time.

## 2022-06-01 ENCOUNTER — Encounter: Payer: Self-pay | Admitting: *Deleted

## 2022-06-01 NOTE — Progress Notes (Unsigned)
Pt attended 05/20/22 screening event where b/p was wnl and blood sugar was 243 and A1C was 8.5. Pt noted no SDOH insecurities at the event. Chart review does not reveal any PCP appt but PCP of record does not use [facility]. Pt called x 3 to verify PCP but no contact made. Letter sent to pt to be sure to f/u with PCP r/t blood sugar of 243 and A1C of 8.5.

## 2022-07-29 NOTE — Progress Notes (Signed)
The patient attended 05/20/22 screening event where his blood sugar screening results were 243mg /dl and Z6X was 8.5. At the event the patient noted he had a PCP and no SDOH needs. At the initial follow-up three phone attempts were made and a letter was mailed to f/u about blood sugar. Per chart review I am unable to see the Pts PCP charts in [facility]. I did a Google search and was able to contact the pcp office at [phone] . The PCP office did confirm that the pt is getting primary care from their provider. Diabetes is something this provider specializes in.   No additional [facility] team support indicated at this time.

## 2022-08-13 ENCOUNTER — Ambulatory Visit
Admission: EM | Admit: 2022-08-13 | Discharge: 2022-08-13 | Disposition: A | Discharge status: Home / Self Care | Payer: Medicare Other | Attending: Family Medicine | Admitting: Family Medicine

## 2022-08-13 DIAGNOSIS — R7309 Other abnormal glucose: Secondary | ICD-10-CM | POA: Diagnosis not present

## 2022-08-13 DIAGNOSIS — R350 Frequency of micturition: Secondary | ICD-10-CM

## 2022-08-13 LAB — POCT URINALYSIS DIP (MANUAL ENTRY)
Bilirubin, UA: NEGATIVE
Blood, UA: NEGATIVE
Glucose, UA: NEGATIVE mg/dL
Ketones, POC UA: NEGATIVE mg/dL
Leukocytes, UA: NEGATIVE
Nitrite, UA: NEGATIVE
Protein Ur, POC: NEGATIVE mg/dL
Spec Grav, UA: 1.02 (ref 1.010–1.025)
Urobilinogen, UA: 1 E.U./dL
pH, UA: 7 (ref 5.0–8.0)

## 2022-08-13 LAB — POCT FASTING CBG KUC MANUAL ENTRY: POCT Glucose (KUC): 109 mg/dL — AB (ref 70–99)

## 2022-08-13 NOTE — Discharge Instructions (Addendum)
Urinalysis is normal.  As discussed start checking your blood sugar every morning before you eat or drink.  Discussed during the community mobile screening it was detected that you were diabetic with an A1c of 8.5.  Your blood sugar was checked today , your reading was within normal range with a reading of 109 .  Check your blood sugar daily any readings greater than 200 contact your primary care provider. Take the  log of your readings to your primary care appointment on July 10.  There were no abnormally found during your visit here at urgent care today.  Return as needed.

## 2022-08-13 NOTE — ED Provider Notes (Signed)
Albert Albert Jordan    CSN: [id number] Arrival date & time: 08/13/22  1346      History   Chief Complaint Chief Complaint  Patient presents with   Possible yeast infection    HPI Albert Albert Jordan is a 72 y.o. male.   HPI Patient with a history of GERD, hyperlipidemia, hypertension presents today complaining by his spouse with concerns for that he may be giving his wife with yeast infections.  Of note patient recently had a A1c and glucose screening at the [facility] mobile training clinic and was found to have an A1c of 8.5, in March 2024.  He has seen his primary [facility] Jordan provider since this time is told that he will have repeat screening labs in August.  His primary [facility] Jordan provider wanted him checking his blood sugar and keeping a log of his fasting glucose he reports he has not started on.  He is a glucometer.  He denies any rash, dysuria but endorses urinary frequency.  Patient gave permission to his wife to provide additional history.  His wife reports that she is also diabetic and was concerned that she was having frequent yeast infections that she may be given the patient yeast infections although patient is completely asymptomatic.  Reports his primary [facility] Jordan provider has advised him previously blood work showed prediabetes. 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  Authorizing Provider  albuterol (PROVENTIL HFA;VENTOLIN HFA) 108 (90 BASE) MCG/ACT inhaler Inhale 2 puffs into the lungs every 6 (six) hours as needed for wheezing or shortness of breath.    [provider]  amLODipine (NORVASC) 10 MG tablet Take 10 mg by mouth daily. 01/16/20   [provider]  hydrochlorothiazide (HYDRODIURIL) 25 MG tablet Take 25 mg by mouth daily. 11/19/19   [provider]  HYDROcodone-acetaminophen (NORCO) 10-325 MG tablet Take 1 tablet by mouth 3 (three) times daily as needed. 01/14/20   [provider]  ibuprofen (ADVIL,MOTRIN) 800 MG tablet Take 800 mg by mouth every 6 (six) hours as needed (pain). Patient not taking: Reported on 02/28/2020    [provider]  Omega-3 Fatty Acids (OMEGA 3 PO) Take 1 capsule by mouth daily.    [provider]  rosuvastatin (CRESTOR) 10 MG tablet Take 10 mg by mouth at bedtime. 01/14/20   [provider]  VITAMIN D PO Take 1.25 mg by mouth once a week.    [provider]    Family History Family History  Problem Relation Age of Onset   Esophageal cancer Father    Colon polyps Sister    Colon cancer Neg Hx    Stomach cancer Neg Hx    Rectal cancer Neg Hx     Social History Social History  Tobacco Use   Smoking status: Former    Types: Cigarettes    Passive exposure: Never   Smokeless tobacco: Never  Vaping Use   Vaping Use: Never used  Substance Use Topics   Alcohol use: No   Drug use: No     Allergies   Patient has no known allergies.   Review of Systems Review of Systems Pertinent negatives listed in HPI  Physical Exam Triage Vital Signs ED Triage Vitals  Enc Vitals Group     BP      Pulse      Resp      Temp      Temp src      SpO2      Weight      Height      Head Circumference      Peak Flow      Pain Score      Pain Loc      Pain Edu?      Excl. in GC?    No data found.  Updated Vital Signs BP 128/77 (BP Location: Left  Arm)   Pulse 60   Temp 97.9 F (36.6 C) (Oral)   Resp 18   SpO2 96%   Visual Acuity Right Eye Distance:   Left Eye Distance:   Bilateral Distance:    Right Eye Near:   Left Eye Near:    Bilateral Near:     Physical Exam Vitals reviewed.  Constitutional:      Appearance: Normal appearance.  HENT:     Head: Normocephalic and atraumatic.  Eyes:     Extraocular Movements: Extraocular movements intact.     Pupils: Pupils are equal, round, and reactive to light.  Cardiovascular:     Rate and Rhythm: Normal rate and regular rhythm.  Pulmonary:     Effort: Pulmonary effort is normal.     Breath sounds: Normal breath sounds.  Musculoskeletal:     Cervical back: Normal range of motion and neck supple.  Skin:    General: Skin is warm and dry.  Neurological:     General: No focal deficit present.     Mental Status: He is alert.      UC Treatments / Results  Labs (all labs ordered are listed, but only abnormal results are displayed) Labs Reviewed  POCT FASTING [facility]G [facility] MANUAL ENTRY - Abnormal; Notable for the following components:      Result Value   POCT Glucose ([facility]) 109 (*)    All other components within normal limits  POCT URINALYSIS DIP (MANUAL ENTRY)    EKG   Radiology No results found.  Procedures Procedures (including critical [facility] Jordan time)  Medications Ordered in UC Medications - No data to display  Initial Impression / Assessment and Plan / UC Course  I have reviewed the triage vital signs and the nursing notes.  Pertinent labs & imaging results that were available during my Albert Jordan of the patient were reviewed by me and considered in my medical decision making (see chart for details).    Urinary Frequency, suspect BPH vs spikes in glucose. Patient will check blood sugar daily keep a log of readings and follow-up with PCP next appointment scheduled in two weeks. Glucose today is 109. Patient is asymptomatic of yeast, therefore no treatment prescribed today.  Patient will follow-up with PCP as needed. Final Clinical Impressions(s) / UC Diagnoses   Final diagnoses:  Urinary frequency  Elevated hemoglobin A1c     Discharge Instructions  Urinalysis is normal.  As discussed start checking your blood sugar every morning before you eat or drink.  Discussed during the community mobile screening it was detected that you were diabetic with an A1c of 8.5.  Your blood sugar was checked today , your reading was within normal range with a reading of 109 .  Check your blood sugar daily any readings greater than 200 contact your primary Albert Jordan provider. Take the  log of your readings to your primary Albert Jordan appointment on July 10.  There were no abnormally found during your visit here at urgent [facility] today.  Return as needed.      ED Prescriptions   None    PDMP not reviewed this encounter.   Bing Neighbors, NP 08/16/22 2226

## 2022-08-13 NOTE — ED Triage Notes (Signed)
Patient's wife states that she has frequent yeast infections and that they were recently married and wants to make sure patient doesn't have yeast as well, and that they aren't "passing it back and forth".

## 2023-08-04 ENCOUNTER — Other Ambulatory Visit (HOSPITAL_COMMUNITY): Payer: Self-pay | Admitting: Nurse Practitioner

## 2023-08-04 DIAGNOSIS — M5451 Vertebrogenic low back pain: Secondary | ICD-10-CM
